# Patient Record
Sex: Female | Born: 1990 | Race: Black or African American | Hispanic: No | Marital: Single | Smoking: Current every day smoker
Health system: Southern US, Community
[De-identification: ages and names within clinical notes are randomized; demographics above are authoritative.]

---
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
## 2011-04-27 ENCOUNTER — Emergency Department: Payer: Self-pay | Admitting: Unknown Physician Specialty

## 2011-04-27 LAB — URINALYSIS, COMPLETE
Bilirubin,UR: NEGATIVE
Blood: NEGATIVE
Glucose,UR: NEGATIVE mg/dL (ref 0–75)
Ketone: NEGATIVE
Leukocyte Esterase: NEGATIVE
Nitrite: NEGATIVE
Ph: 5 (ref 4.5–8.0)
Protein: NEGATIVE
RBC,UR: 1 /HPF (ref 0–5)
Specific Gravity: 1.005 (ref 1.003–1.030)
Squamous Epithelial: 3
WBC UR: 1 /HPF (ref 0–5)

## 2011-04-27 LAB — DRUG SCREEN, URINE
Cannabinoid 50 Ng, Ur ~~LOC~~: NEGATIVE (ref ?–50)
Cocaine Metabolite,Ur ~~LOC~~: NEGATIVE (ref ?–300)
MDMA (Ecstasy)Ur Screen: NEGATIVE (ref ?–500)
Methadone, Ur Screen: NEGATIVE (ref ?–300)
Opiate, Ur Screen: NEGATIVE (ref ?–300)
Tricyclic, Ur Screen: NEGATIVE (ref ?–1000)

## 2011-04-27 LAB — CBC
MCHC: 32.9 g/dL (ref 32.0–36.0)
MCV: 89 fL (ref 80–100)
Platelet: 250 10*3/uL (ref 150–440)
RBC: 4.08 10*6/uL (ref 3.80–5.20)
RDW: 13.6 % (ref 11.5–14.5)
WBC: 9.8 10*3/uL (ref 3.6–11.0)

## 2011-04-27 LAB — COMPREHENSIVE METABOLIC PANEL
Albumin: 4.2 g/dL (ref 3.4–5.0)
Alkaline Phosphatase: 80 U/L (ref 50–136)
Bilirubin,Total: 0.7 mg/dL (ref 0.2–1.0)
Calcium, Total: 8.5 mg/dL (ref 8.5–10.1)
Chloride: 106 mmol/L (ref 98–107)
EGFR (African American): >60
EGFR (Non-African Amer.): >60
Glucose: 121 mg/dL — ABNORMAL HIGH (ref 65–99)
Osmolality: 286 (ref 275–301)
Potassium: 2.9 mmol/L — ABNORMAL LOW (ref 3.5–5.1)
SGOT(AST): 25 U/L (ref 15–37)
SGPT (ALT): 16 U/L
Sodium: 144 mmol/L (ref 136–145)
Total Protein: 7.7 g/dL (ref 6.4–8.2)

## 2011-04-27 LAB — MAGNESIUM: Magnesium: 1.9 mg/dL

## 2011-04-27 LAB — HCG, QUANTITATIVE, PREGNANCY: Beta Hcg, Quant.: <1 m[IU]/mL — ABNORMAL LOW

## 2011-06-18 IMAGING — US US OB < 14 WEEKS
1 series · 17 of 28 positions shown · non-contrast
Comparison: none

REASON FOR EXAM: unsure dates
COMMENTS:   LMP: August 2008

[Series 1: us ob < 14 weeks · 17 of 69 slices shown]
[im 1/69]
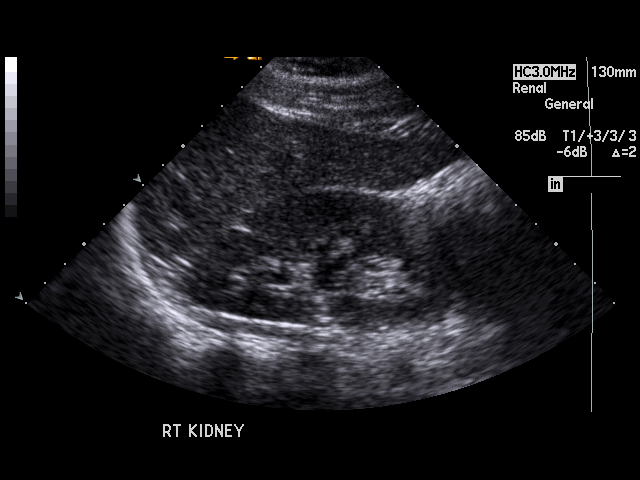
[im 6/69]
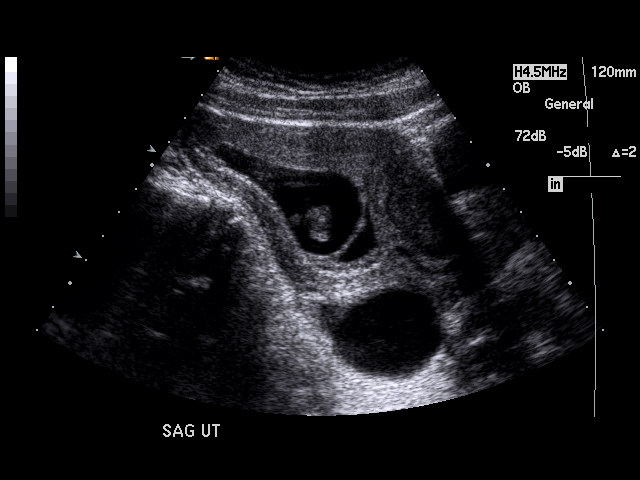
[im 11/69]
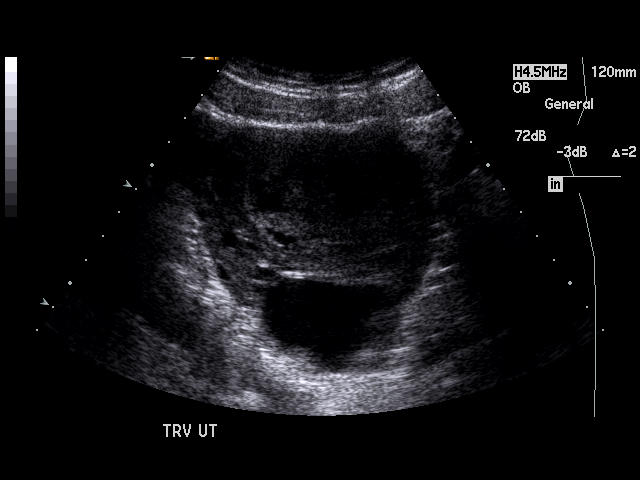
[im 13/69]
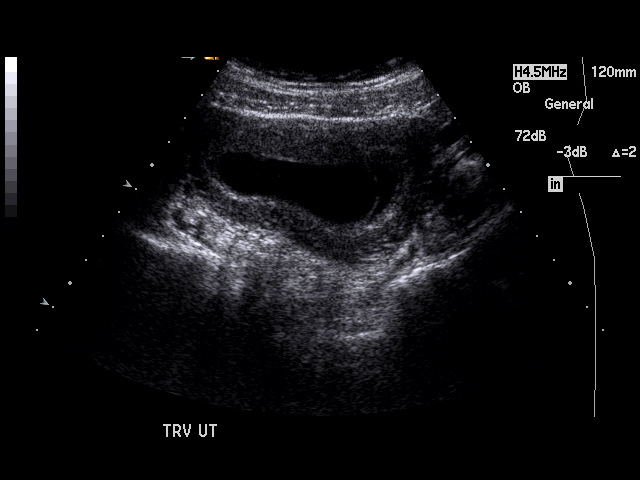
[im 18/69]
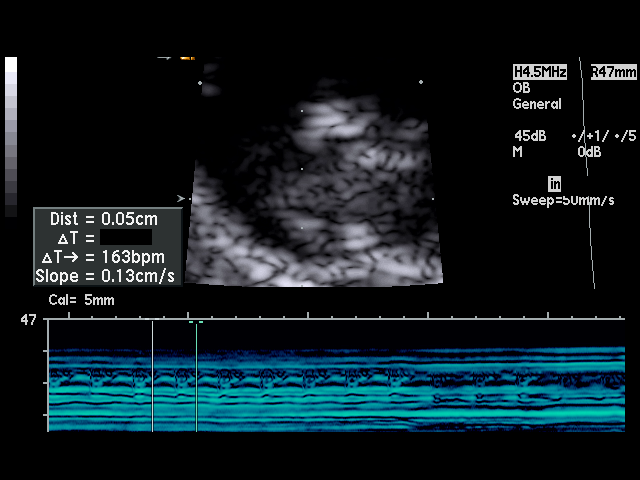
[im 23/69]
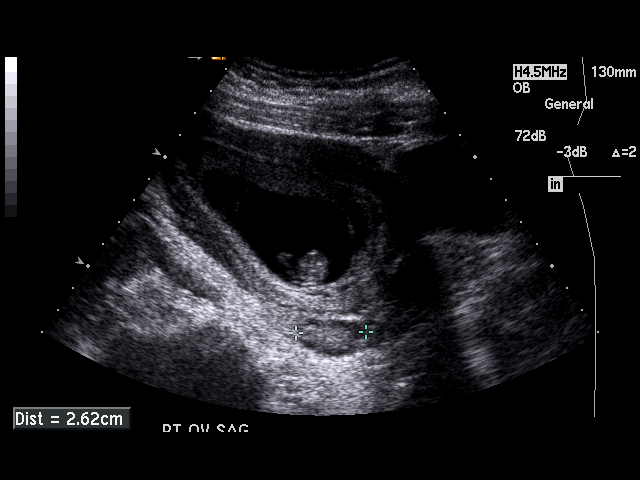
[im 26/69]
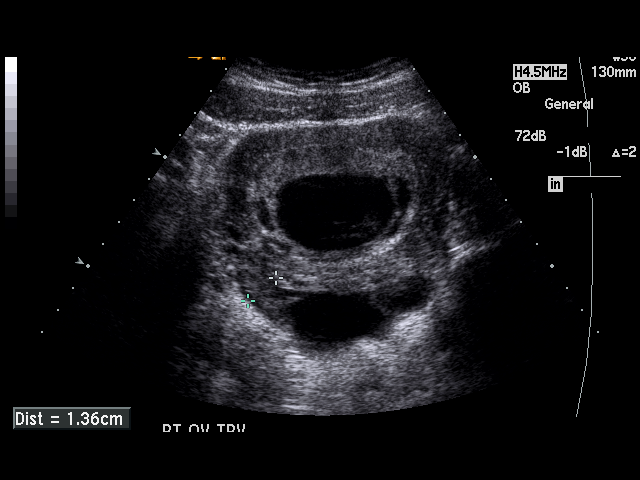
[im 31/69]
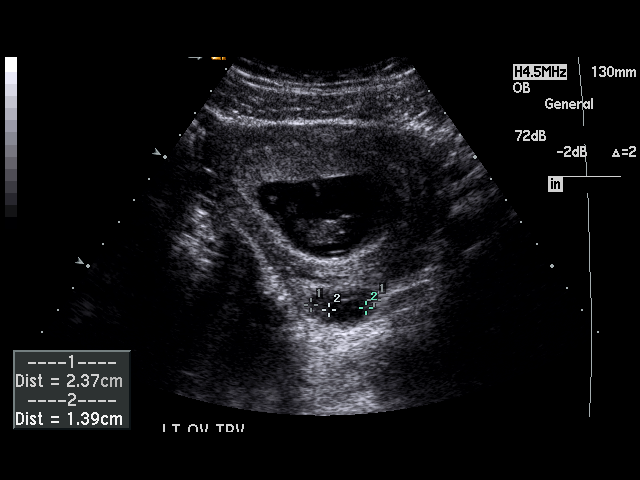
[im 36/69]
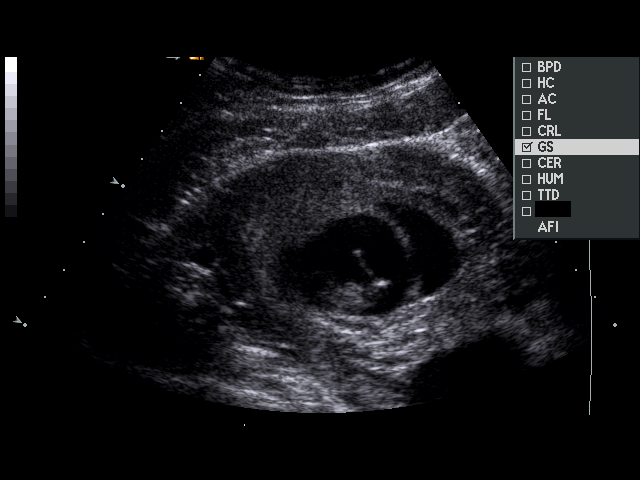
[im 38/69]
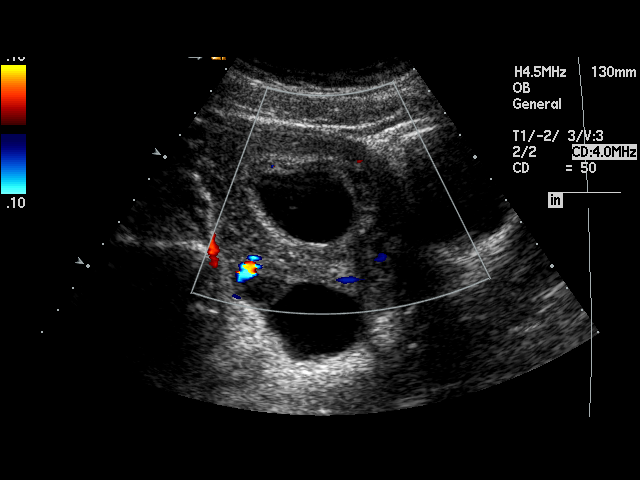
[im 43/69]
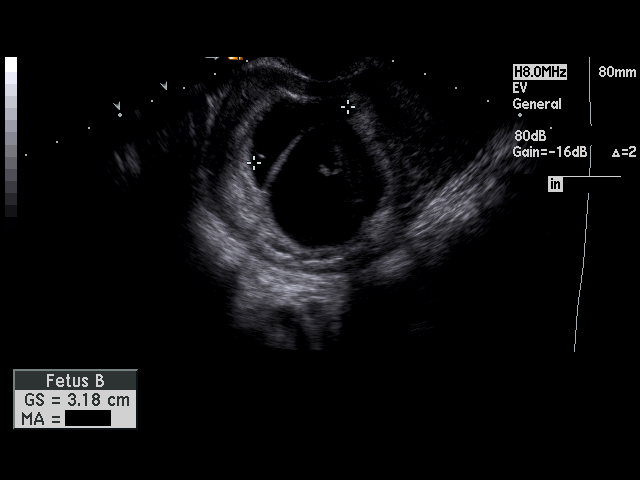
[im 46/69]
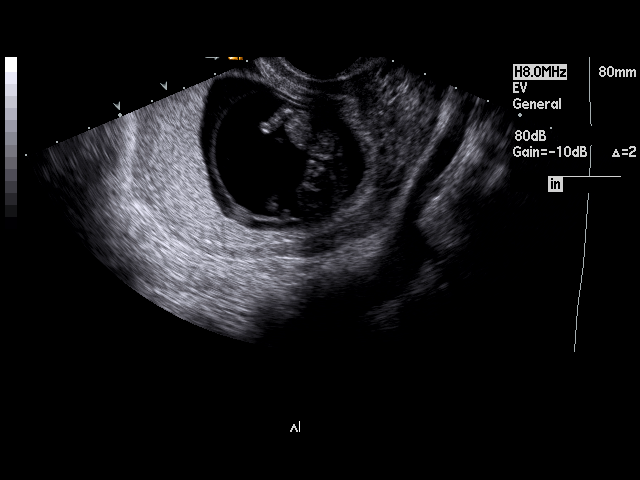
[im 51/69]
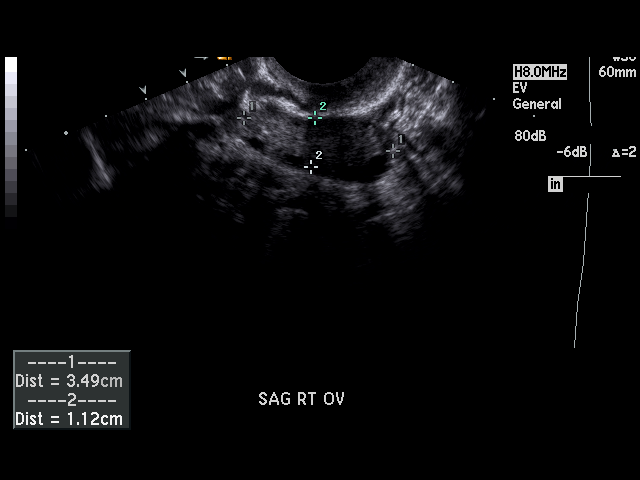
[im 56/69]
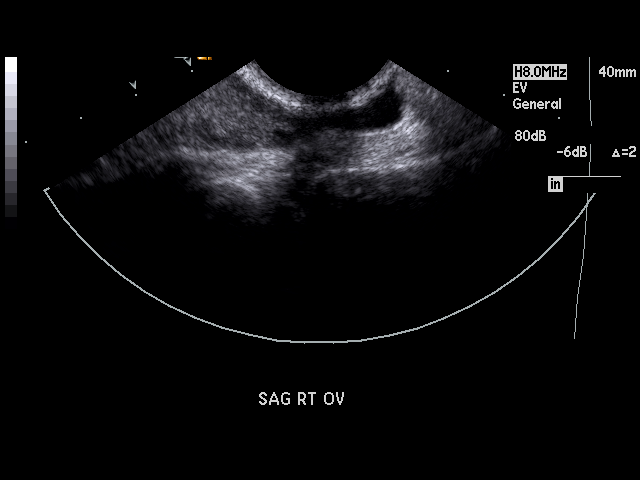
[im 58/69]
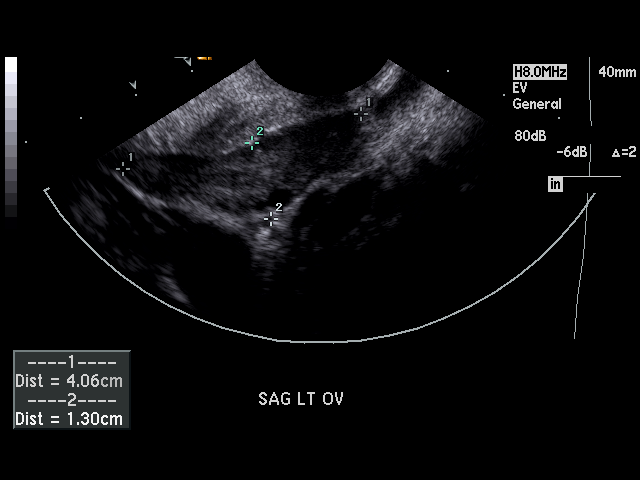
[im 63/69]
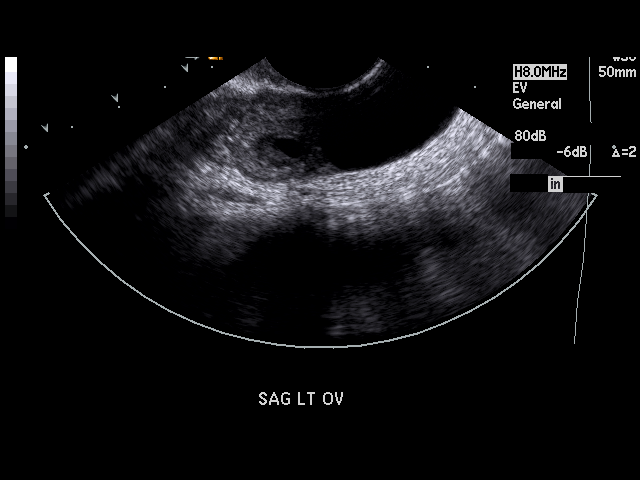
[im 69/69]
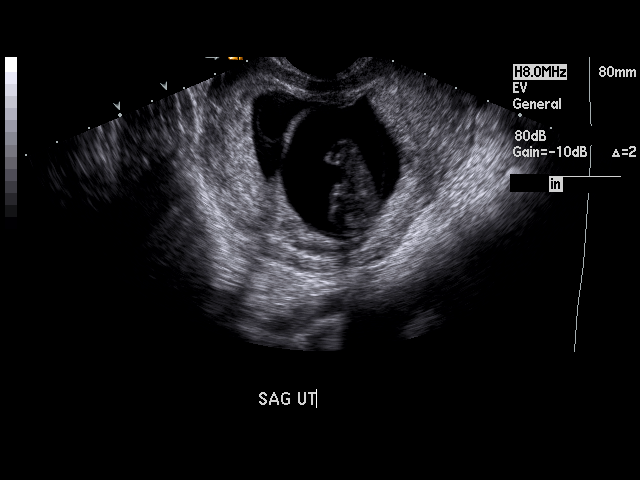

[17 of 28 positions shown; findings below may reference images not displayed]

PROCEDURE:     US  - US OB LESS THAN 14 WEEKS  - November 22, 2008  [DATE]

RESULT:     There are 2 saccular structures present cyst just being a twin
gestation. However, only one fetal pole is identified. The crown-rump length
of this fetal pole is 3.56 cm corresponding to an 10 week 3 day gestation.
The second portion of the gestational sac measures 2.76 cm which would
correspond to a 7 week 4 day pregnancy but again no fetal pole is
demonstrated. Fetal cardiac activity with a rate of 163 beats per minute was
demonstrated.

The maternal ovaries are normal in size measuring 3.5 x 2.9 x 1.1 cm on the
right. There is a nonsimple cyst associated with the left ovary which
measures 4 x 4.4 x 1.9 cm. The overall maximal dimension of the left ovary
is 4.1 x 1.7 x 1.3 cm.
IMPRESSION: 1. There is a gravid uterus present. There may be dual gestational sacs but
only one fetal pole is identified. This has estimated gestational age of 10
weeks 3 days + / -6 days with an estimated date of confinement 17 June, 2009.
Again, no twin B is demonstrated.

2. There is a dominant cyst associated with the left ovary measuring 4 x 4
0.4 x 1.9 cm. There is some debris within this cystic structure.

Correlation with the patient's clinical exam and beta-hCG will be needed.
Followup ultrasound is recommended.

## 2013-11-20 IMAGING — CT CT HEAD WITHOUT CONTRAST
2 series · 16 of 30 positions shown, 20 images · non-contrast
Comparison: none

REASON FOR EXAM: alcohol intoxication/ams
COMMENTS:

[Series 2: without · axial · non-contrast · 0.39mm/px · z∈[-176,-52]mm · 13 of 31 slices shown, 17 images]
[im 3/31  brain]
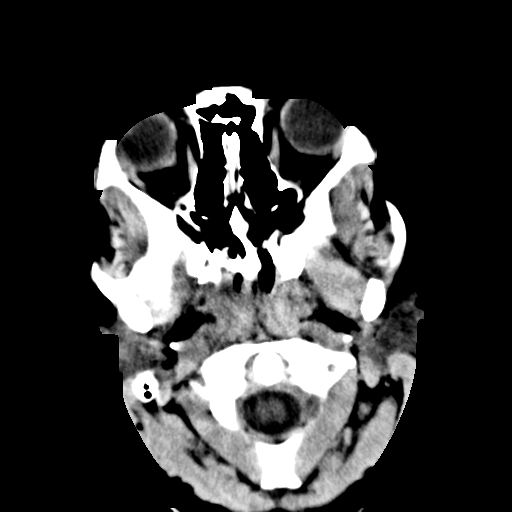
[im 3/31  bone]
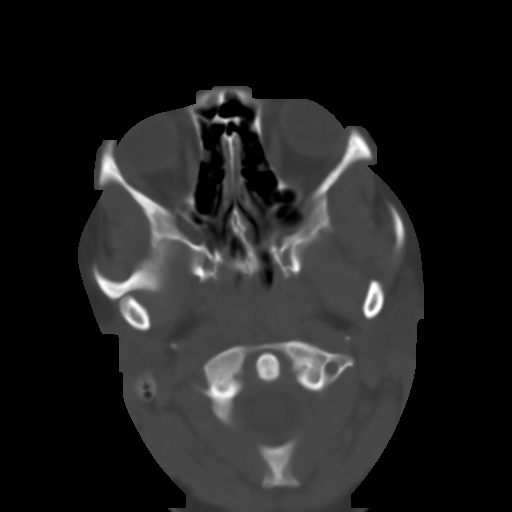
[im 5/31  brain]
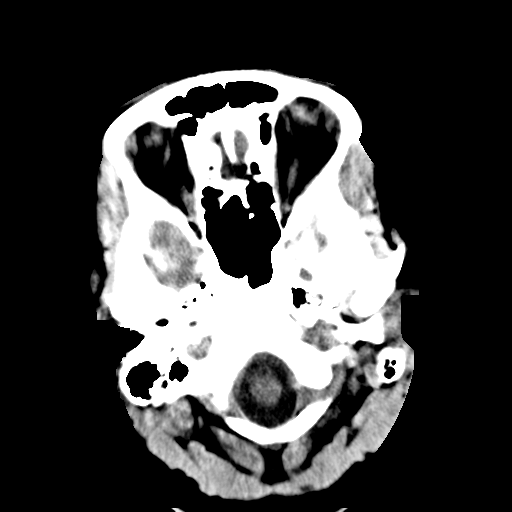
[im 7/31  brain]
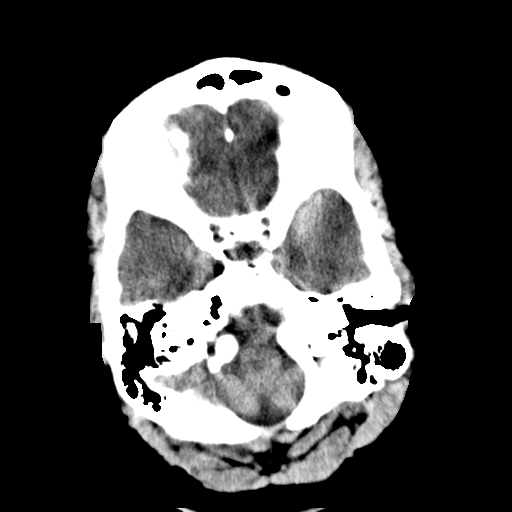
[im 9/31  brain]
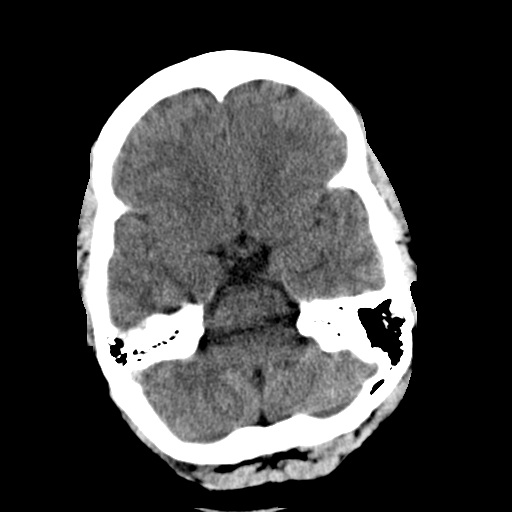
[im 11/31  brain]
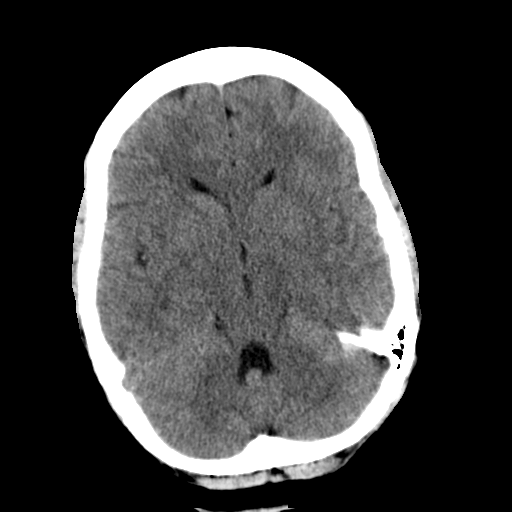
[im 11/31  bone]
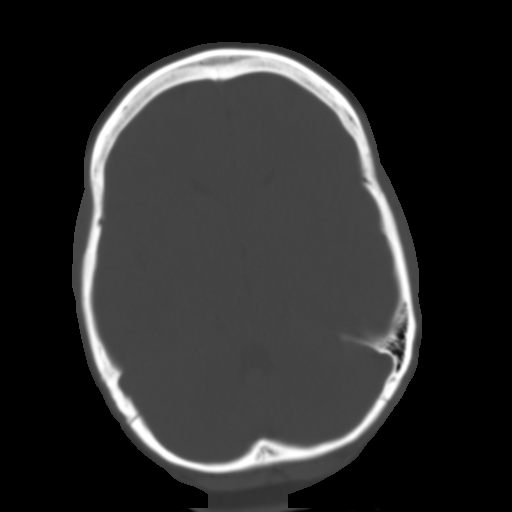
[im 13/31  brain]
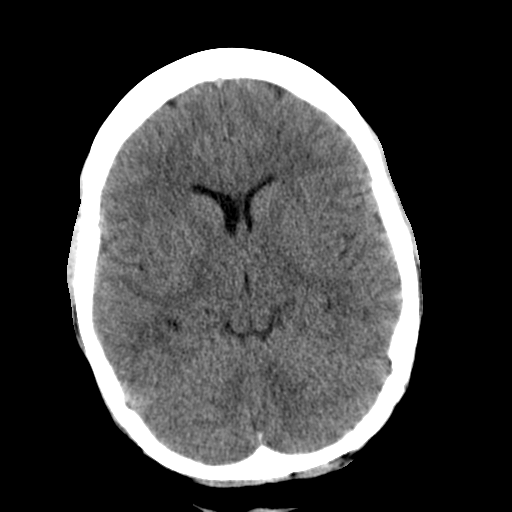
[im 16/31  brain]
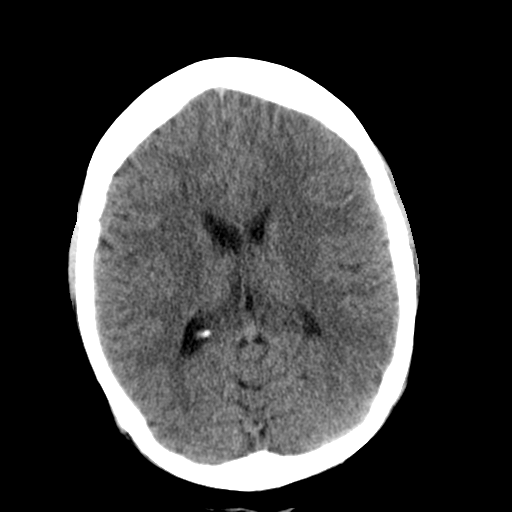
[im 18/31  brain]
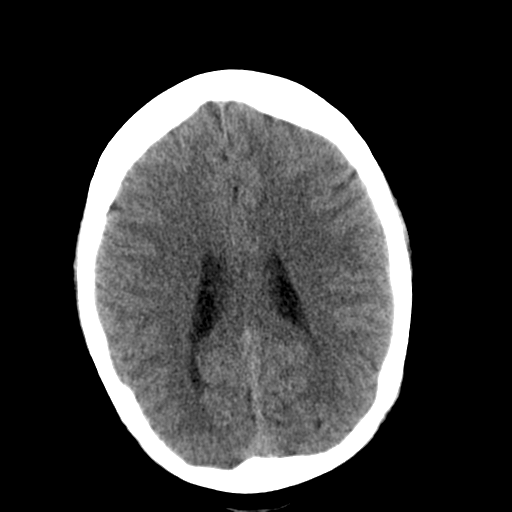
[im 20/31  brain]
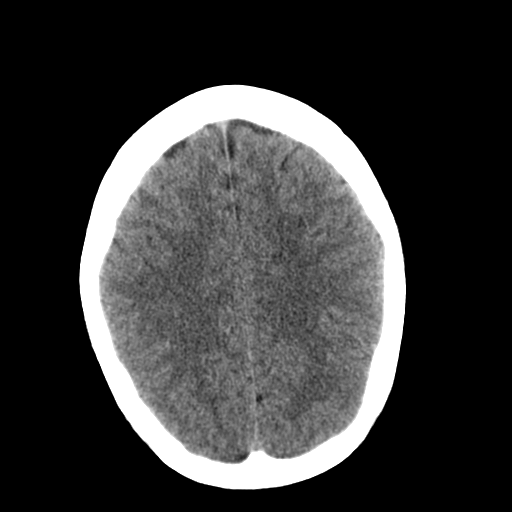
[im 20/31  bone]
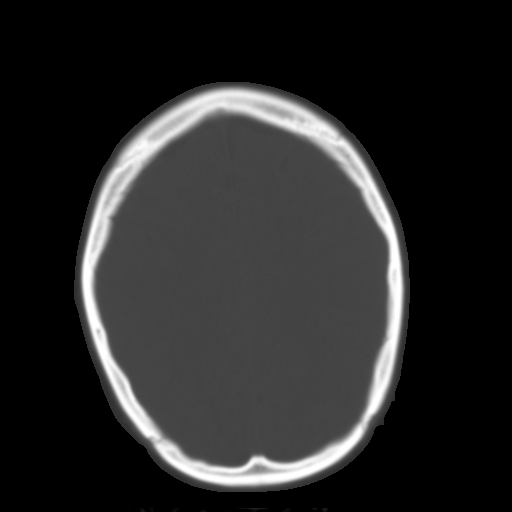
[im 22/31  brain]
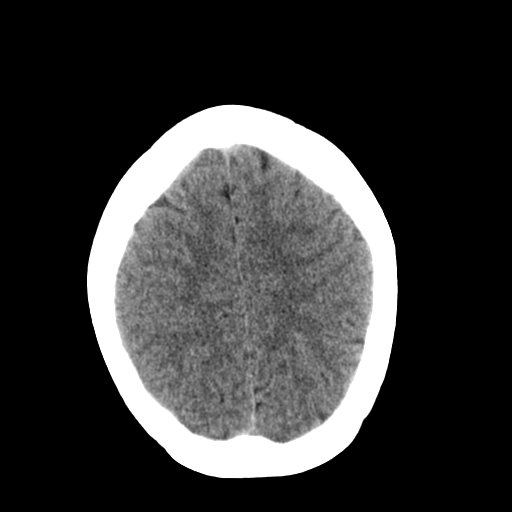
[im 24/31  brain]
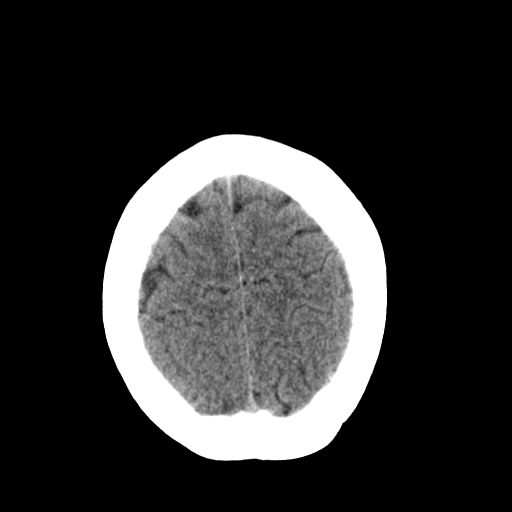
[im 26/31  brain]
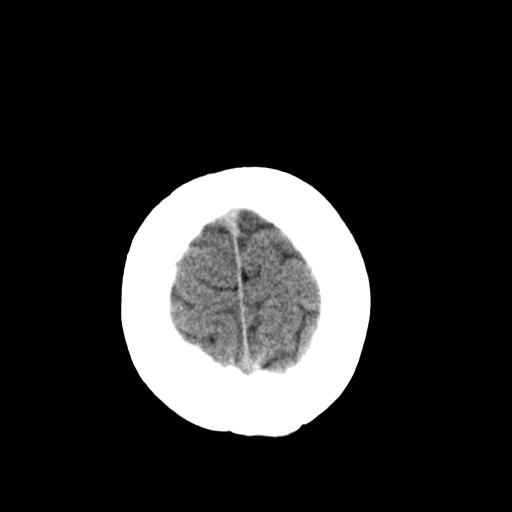
[im 28/31  brain]
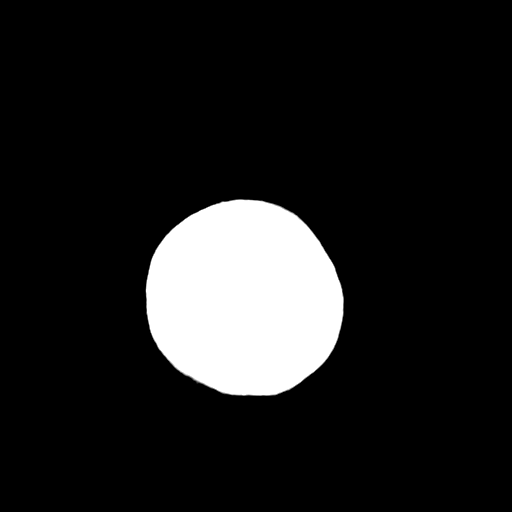
[im 28/31  bone]
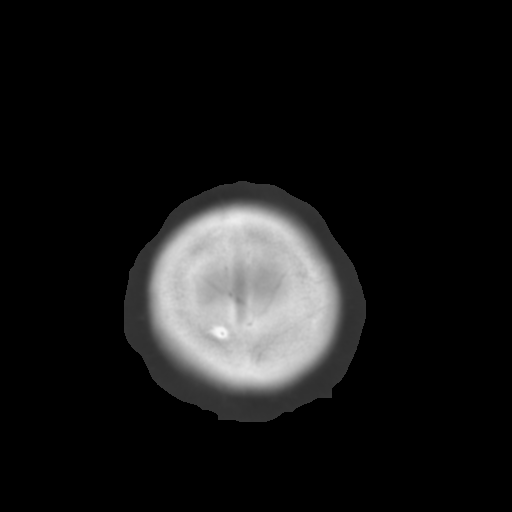

[Series 3: bone · axial · 0.39mm/px · z∈[-176,-136]mm · 3 of 31 slices shown]
[im 3/31  bone]
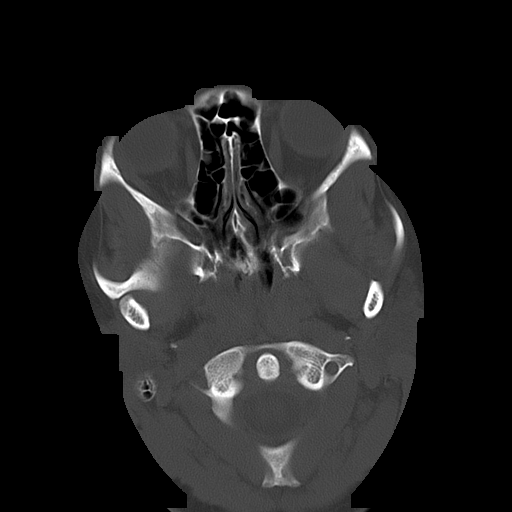
[im 7/31  bone]
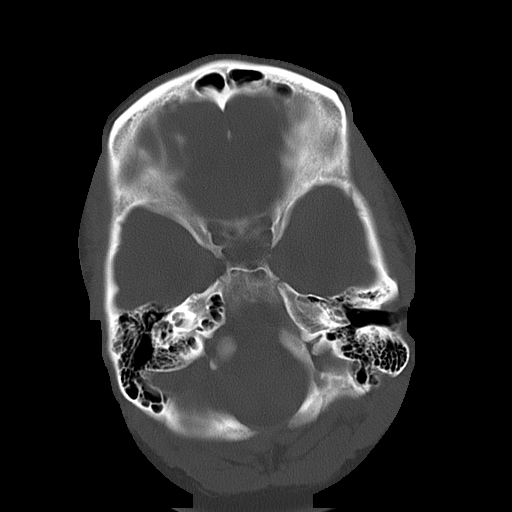
[im 11/31  bone]
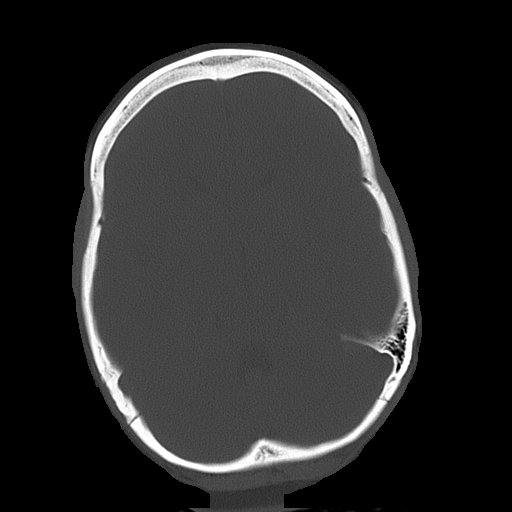

[16 of 30 positions shown; findings below may reference images not displayed]

PROCEDURE:     CT  - CT HEAD WITHOUT CONTRAST  - April 27, 2011  [DATE]

RESULT:     Emergent noncontrast CT of the brain is performed. The patient
has no previous exam for comparison.

The ventricles and sulci are normal. There is no hemorrhage. There is no
focal mass, mass-effect or midline shift. There is no evidence of edema or
territorial infarct. The bone windows demonstrate normal aeration of the
paranasal sinuses and mastoid air cells. There is no skull fracture
demonstrated.
IMPRESSION: 1. No acute intracranial abnormality.

## 2015-10-10 ENCOUNTER — Emergency Department
Admission: EM | Admit: 2015-10-10 | Discharge: 2015-10-10 | Disposition: A | Discharge status: Home / Self Care | Payer: Self-pay | Attending: Emergency Medicine | Admitting: Emergency Medicine

## 2015-10-10 ENCOUNTER — Encounter: Payer: Self-pay | Admitting: Emergency Medicine

## 2015-10-10 DIAGNOSIS — H6091 Unspecified otitis externa, right ear: Secondary | ICD-10-CM | POA: Insufficient documentation

## 2015-10-10 DIAGNOSIS — H6691 Otitis media, unspecified, right ear: Secondary | ICD-10-CM | POA: Insufficient documentation

## 2015-10-10 DIAGNOSIS — F17210 Nicotine dependence, cigarettes, uncomplicated: Secondary | ICD-10-CM | POA: Insufficient documentation

## 2015-10-10 MED ORDER — CIPROFLOXACIN-DEXAMETHASONE 0.3-0.1 % OT SUSP
4.0000 [drp] | Freq: Once | OTIC | Status: AC
  Administered 2015-10-10: 4 [drp] via OTIC
  Filled 2015-10-10: qty 7.5

## 2015-10-10 MED ORDER — AMOXICILLIN-POT CLAVULANATE 875-125 MG PO TABS: 1.0000 | ORAL_TABLET | Freq: Two times a day (BID) | ORAL | 0 refills | Status: AC

## 2015-10-10 MED ORDER — AMOXICILLIN-POT CLAVULANATE 875-125 MG PO TABS
1.0000 | ORAL_TABLET | Freq: Once | ORAL | Status: AC
  Administered 2015-10-10: 1 via ORAL
  Filled 2015-10-10: qty 1

## 2015-10-10 NOTE — ED Provider Notes (Signed)
[facility] Emergency Department Provider Note    First MD Initiated Contact with Patient 10/10/15 0407     (approximate)  I have reviewed the triage vital signs and the nursing notes.   HISTORY  Chief Complaint Otalgia and Facial Pain    HPI Kristin Shepard is a 25 y.o. female presents with right ear pain 3 weeks associated with right-sided facial pain with onset today. Patient states discomfort started after going swimming at [facility] approximately 3 weeks ago. Patient denies any fever afebrile on presentation temperature 98.6. Patient denies any drainage from the ear   Past medical history None There are no active problems to display for this patient.   Past surgical history None  Prior to Admission medications   Not on File    Allergies Review of patient's allergies indicates no known allergies.  No family history on file.  Social History Social History  Substance Use Topics  . Smoking status: Current Every Day Smoker    Packs/day: 0.50    Types: Cigarettes  . Smokeless tobacco: Never Used  . Alcohol use No    Review of Systems Constitutional: No fever/chills Eyes: No visual changes. ENT: No sore throat.Positive for right ear pain Cardiovascular: Denies chest pain. Respiratory: Denies shortness of breath. Gastrointestinal: No abdominal pain.  No nausea, no vomiting.  No diarrhea.  No constipation. Genitourinary: Negative for dysuria. Musculoskeletal: Negative for back pain. Skin: Negative for rash. Neurological: Negative for headaches, focal weakness or numbness.  10-point ROS otherwise negative.  ____________________________________________   PHYSICAL EXAM:  VITAL SIGNS: ED Triage Vitals  Enc Vitals Group     BP 10/10/15 0355 125/67     Pulse Rate 10/10/15 0355 68     Resp 10/10/15 0355 18     Temp 10/10/15 0355 98.6 F (37 C)     Temp Source 10/10/15 0355 Oral     SpO2 10/10/15 0355 100 %     Weight  10/10/15 0327 160 lb (72.6 kg)     Height 10/10/15 0327 5\' 2"  (1.575 m)     Head Circumference --      Peak Flow --      Pain Score 10/10/15 0327 8     Pain Loc --      Pain Edu? --      Excl. in GC? --     Constitutional: Alert and oriented. Well appearing and in no acute distress. Eyes: Conjunctivae are normal. PERRL. EOMI. Head: Atraumatic. Ears:  Exudate noted right external auditory canal with TM dullness and exudate posterior to the TM. Nose: No congestion/rhinnorhea. Mouth/Throat: Mucous membranes are moist.  Oropharynx non-erythematous. Neck: No stridor.  No meningeal signs.  Cardiovascular: Normal rate, regular rhythm. Good peripheral circulation. Grossly normal heart sounds. Respiratory: Normal respiratory effort.  No retractions. Lungs CTAB. Gastrointestinal: Soft and nontender. No distention.  Musculoskeletal: No lower extremity tenderness nor edema. No gross deformities of extremities. Neurologic:  Normal speech and language. No gross focal neurologic deficits are appreciated.  Skin:  Skin is warm, dry and intact. No rash noted. Psychiatric: Mood and affect are normal. Speech and behavior are normal.    Procedures     INITIAL IMPRESSION / ASSESSMENT AND PLAN / ED COURSE  Pertinent labs & imaging results that were available during my care of the patient were reviewed by me and considered in my medical decision making (see chart for details).  History physical exam consistent with acute otitis externa and media on the right.  Clinical Course    ____________________________________________  FINAL CLINICAL IMPRESSION(S) / ED DIAGNOSES  Final diagnoses:  Otitis externa, right  Acute right otitis media, recurrence not specified, unspecified otitis media type     MEDICATIONS GIVEN DURING THIS VISIT:  Medications - No data to display   NEW OUTPATIENT MEDICATIONS STARTED DURING THIS VISIT:  New Prescriptions   No medications on file    Modified  Medications   No medications on file    Discontinued Medications   No medications on file     Note:  This document was prepared using Dragon voice recognition software and may include unintentional dictation errors.    Darci Currentandolph N Brown, MD 10/10/15 [phone]

## 2015-10-10 NOTE — ED Triage Notes (Signed)
Pt presents to ED with c/o right ear pain for over 3 weeks and right sided facial pain "all day today". Denies fever or ear drainage.

## 2018-08-11 ENCOUNTER — Encounter: Payer: Self-pay | Admitting: Nurse Practitioner

## 2018-11-09 ENCOUNTER — Ambulatory Visit: Payer: Self-pay

## 2018-11-11 ENCOUNTER — Ambulatory Visit: Payer: Self-pay | Admitting: Physician Assistant

## 2018-11-11 ENCOUNTER — Other Ambulatory Visit: Payer: Self-pay

## 2018-11-11 DIAGNOSIS — Z113 Encounter for screening for infections with a predominantly sexual mode of transmission: Secondary | ICD-10-CM

## 2018-11-11 DIAGNOSIS — N760 Acute vaginitis: Secondary | ICD-10-CM

## 2018-11-11 DIAGNOSIS — B9689 Other specified bacterial agents as the cause of diseases classified elsewhere: Secondary | ICD-10-CM

## 2018-11-11 LAB — WET PREP FOR TRICH, YEAST, CLUE
Trichomonas Exam: NEGATIVE
Yeast Exam: NEGATIVE

## 2018-11-11 MED ORDER — METRONIDAZOLE 500 MG PO TABS: 500.0000 mg | ORAL_TABLET | Freq: Two times a day (BID) | ORAL | 0 refills | Status: AC

## 2018-11-12 ENCOUNTER — Encounter: Payer: Self-pay | Admitting: Physician Assistant

## 2018-11-12 NOTE — Progress Notes (Signed)
    STI clinic/screening visit  Subjective:  Kristin Shepard is a 28 y.o. female being seen today for an STI screening visit. The patient reports they do have symptoms.  Patient has the following medical conditions:  There are no active problems to display for this patient.    Chief Complaint  Patient presents with  . SEXUALLY TRANSMITTED DISEASE    HPI  Patient reports that she has noticed a vaginal discharge with odor for the last 2 days.  Denies other symptoms.  LMP 10/18/2018 and normal.  See flowsheet for further details and programmatic requirements.    The following portions of the patient's history were reviewed and updated as appropriate: allergies, current medications, past medical history, past social history, past surgical history and problem list.  Objective:  There were no vitals filed for this visit.  Physical Exam Constitutional:      General: She is not in acute distress.    Appearance: Normal appearance.  HENT:     Head: Normocephalic and atraumatic.     Mouth/Throat:     Mouth: Mucous membranes are moist.     Pharynx: Oropharynx is clear. No oropharyngeal exudate or posterior oropharyngeal erythema.  Eyes:     Conjunctiva/sclera: Conjunctivae normal.  Neck:     Musculoskeletal: Neck supple.  Pulmonary:     Effort: Pulmonary effort is normal.  Abdominal:     Palpations: Abdomen is soft. There is no mass.     Tenderness: There is no abdominal tenderness. There is no guarding or rebound.  Genitourinary:    General: Normal vulva.     Rectum: Normal.     Comments: External genitalia/pubic area without nits, lice, edema, erythema, lesions and inguinal adenopathy. Vagina with normal mucosa and small amount of thin, grayish discharge, pH=>4.5. Cervix without visible lesions. Uterus firm, mobile, nt, no masses, no CMT, no adnexal tenderness or fullness. Lymphadenopathy:     Cervical: No cervical adenopathy.  Skin:    General: Skin is warm and dry.   Findings: No bruising, erythema, lesion or rash.  Neurological:     Mental Status: She is alert and oriented to person, place, and time.  Psychiatric:        Mood and Affect: Mood normal.        Behavior: Behavior normal.        Thought Content: Thought content normal.        Judgment: Judgment normal.       Assessment and Plan:  Kristin Shepard is a 27 y.o. female presenting to the [facility] Department for STI screening  1. Screening for STD (sexually transmitted disease) Patient here with symptoms today.   Declines blood work today. Rec condoms with all sex. Await test results.  Counseled that RN will call if needs to RTC for further treatment once results are back. - WET PREP FOR [place], YEAST, CLUE - Gonococcus culture - [facility]  2. BV (bacterial vaginosis) Will treat for BV with Metronidazole 500mg  #14 1 po BID for 7 days with food, no EtOH for 24 hr before and until 72 hr after completing medicine. No sex for 7 days  Rec use OTC antifungal cream if has itching during or after completing antibiotic. - metroNIDAZOLE (FLAGYL) 500 MG tablet; Take 1 tablet (500 mg total) by mouth 2 (two) times daily for 7 days.  Dispense: 14 tablet; Refill: 0     No follow-ups on file.  No future appointments.  Jerene Dilling, PA

## 2018-11-16 LAB — GONOCOCCUS CULTURE

## 2019-01-15 ENCOUNTER — Other Ambulatory Visit: Payer: Self-pay

## 2019-01-15 ENCOUNTER — Encounter: Payer: Self-pay | Admitting: Family Medicine

## 2019-01-15 ENCOUNTER — Ambulatory Visit: Payer: Self-pay | Admitting: Family Medicine

## 2019-01-15 DIAGNOSIS — Z113 Encounter for screening for infections with a predominantly sexual mode of transmission: Secondary | ICD-10-CM

## 2019-01-15 LAB — WET PREP FOR TRICH, YEAST, CLUE
Trichomonas Exam: NEGATIVE
Yeast Exam: NEGATIVE

## 2019-01-15 MED ORDER — METRONIDAZOLE 500 MG PO TABS: 500.0000 mg | ORAL_TABLET | Freq: Two times a day (BID) | ORAL | 0 refills | Status: AC

## 2019-01-15 NOTE — Progress Notes (Signed)
In for visit with c/o discharge & odor; declines HIV/RPR testing Debera Lat, RN Wet prep reviewed-+BV treated per standing order Debera Lat, RN

## 2019-01-15 NOTE — Progress Notes (Signed)
[facility] Department STI clinic/screening visit  Subjective:  Kristin Shepard is a 28 y.o. female being seen today for  Chief Complaint  Patient presents with  . SEXUALLY TRANSMITTED DISEASE     The patient reports they do have symptoms. Patient reports that they might desire a pregnancy in the next year. They reported they are not interested in discussing contraception today.   Patient has the following medical conditions:  There are no problems to display for this patient.   HPI  Pt reports has had vaginal d/c x1 wk, +odor. Gets BV and wondering if it is back.   See flowsheet for further details and programmatic requirements.    Patient's last menstrual period was 01/09/2019 (exact date). Last sex: 11/24 BCM: condoms, sometimes Desires EC? n/a  No components found for: HCV  The following portions of the patient's history were reviewed and updated as appropriate: allergies, current medications, past medical history, past social history, past surgical history and problem list.  Objective:  There were no vitals filed for this visit.   Physical Exam Vitals and nursing note reviewed.  Constitutional:      Appearance: Normal appearance.  HENT:     Head: Normocephalic and atraumatic.     Mouth/Throat:     Mouth: Mucous membranes are moist.     Pharynx: Oropharynx is clear. No oropharyngeal exudate or posterior oropharyngeal erythema.  Pulmonary:     Effort: Pulmonary effort is normal.  Abdominal:     General: Abdomen is flat.     Palpations: There is no mass.     Tenderness: There is no abdominal tenderness. There is no rebound.  Genitourinary:    General: Normal vulva.     Exam position: Lithotomy position.     Pubic Area: No rash or pubic lice.      Labia:        Right: No rash or lesion.        Left: No rash or lesion.      Vagina: Vaginal discharge (moderate, white, ph >4.5) present. No erythema, bleeding or lesions.     Cervix: No cervical motion  tenderness, discharge, friability, lesion or erythema.     Uterus: Normal.      Adnexa: Right adnexa normal and left adnexa normal.     Rectum: Normal.  Lymphadenopathy:     Head:     Right side of head: No preauricular or posterior auricular adenopathy.     Left side of head: No preauricular or posterior auricular adenopathy.     Cervical: No cervical adenopathy.     Upper Body:     Right upper body: No supraclavicular or axillary adenopathy.     Left upper body: No supraclavicular or axillary adenopathy.     Lower Body: No right inguinal adenopathy. No left inguinal adenopathy.  Skin:    General: Skin is warm and dry.     Findings: No rash.  Neurological:     Mental Status: She is alert and oriented to person, place, and time.      Assessment and Plan:  Kristin Shepard is a 28 y.o. female presenting to the [facility] Department for STI screening    1. Screening examination for venereal disease -Screenings today as below. Treat wet prep per standing order, will also treat for BV -Patient does not meet criteria for HepB, HepC Screening. Declines HIV and syphilis screenings. -Counseled on warning s/sx and when to seek care. Recommended condom use with all sex  and discussed importance of condom use for STI prevention.  - WET PREP FOR TRICH, YEAST, CLUE - Chlamydia/Gonorrhea [facility]  2. Bacterial Vaginosis -Wet prep +for BV. RN treated per standing order, see RN note.   Encouraged pt to RTC if she becomes interested in discussing BCM.   Return for screening as needed.  No future appointments.  Ann Held, PA-C

## 2019-01-16 ENCOUNTER — Encounter: Payer: Self-pay | Admitting: Family Medicine

## 2019-08-06 ENCOUNTER — Ambulatory Visit (LOCAL_COMMUNITY_HEALTH_CENTER): Payer: Self-pay

## 2019-08-06 ENCOUNTER — Other Ambulatory Visit: Payer: Self-pay

## 2019-08-06 VITALS — BP 111/73 | Ht 62.0 in | Wt 197.0 lb

## 2019-08-06 DIAGNOSIS — Z3201 Encounter for pregnancy test, result positive: Secondary | ICD-10-CM

## 2019-08-06 LAB — PREGNANCY, URINE: Preg Test, Ur: POSITIVE — AB

## 2019-08-06 MED ORDER — PRENATAL 27-0.8 MG PO TABS: 1.0000 | ORAL_TABLET | Freq: Every day | ORAL | 0 refills | Status: AC

## 2019-08-06 NOTE — Progress Notes (Signed)
UPT positive today. Unsure where she will go for prenatal care. Community/prenatal  resource provider list given to pt. Consulted with Beatris Si, PA re: pt's report that she was high risk during last preg. d/t "umbilical cord being in different location." Enc to establish prenatal care early.Counseled re: warning signs of ectopic preg. And advised to seek immediate medical attn if symptoms arise. Pt denies any symptoms presently.  Prenatal vitamins dispensed today. To clerk for preadmit. Jerel Shepherd, RN

## 2019-09-10 ENCOUNTER — Other Ambulatory Visit: Payer: Self-pay | Admitting: Obstetrics

## 2019-09-10 ENCOUNTER — Other Ambulatory Visit: Payer: Self-pay

## 2019-09-10 ENCOUNTER — Encounter: Payer: Self-pay | Admitting: Obstetrics

## 2019-09-10 ENCOUNTER — Ambulatory Visit (INDEPENDENT_AMBULATORY_CARE_PROVIDER_SITE_OTHER): Payer: Medicaid Other | Admitting: Obstetrics

## 2019-09-10 ENCOUNTER — Other Ambulatory Visit (HOSPITAL_COMMUNITY)
Admission: RE | Admit: 2019-09-10 | Discharge: 2019-09-10 | Disposition: A | Discharge status: Home / Self Care | Payer: Medicaid Other | Source: Ambulatory Visit | Attending: Obstetrics | Admitting: Obstetrics

## 2019-09-10 VITALS — BP 120/80 | Wt 199.0 lb

## 2019-09-10 DIAGNOSIS — Z3480 Encounter for supervision of other normal pregnancy, unspecified trimester: Secondary | ICD-10-CM | POA: Insufficient documentation

## 2019-09-10 DIAGNOSIS — N912 Amenorrhea, unspecified: Secondary | ICD-10-CM

## 2019-09-10 DIAGNOSIS — Z3A09 9 weeks gestation of pregnancy: Secondary | ICD-10-CM

## 2019-09-10 DIAGNOSIS — O0990 Supervision of high risk pregnancy, unspecified, unspecified trimester: Secondary | ICD-10-CM

## 2019-09-10 DIAGNOSIS — O0991 Supervision of high risk pregnancy, unspecified, first trimester: Secondary | ICD-10-CM

## 2019-09-10 NOTE — Progress Notes (Signed)
New Obstetric Patient H&P    Chief Complaint: "Desires prenatal care"   History of Present Illness: Patient is a 29 y.o. G2P1001 Not Hispanic or Latino female, LMP 07/09/2019 presents with amenorrhea and positive home pregnancy test. Based on her  LMP, her EDD is Estimated Date of Delivery: 04/14/20 and her EGA is [redacted]w[redacted]d. Cycles are 6. days, regular, and occur approximately every : 28 days. Her last pap smear was a few years ago and was unknown result as she cannot remember.    She had a urine pregnancy test which was positive about 2 week(s)  ago. Her last menstrual period was normal and lasted for  approximately 5 day(s). Since her LMP she claims she has experienced mild nausea. She denies vaginal bleeding. Her past medical history is noncontributory. Her prior pregnancies are notable for tobacco use, a "cord issue" per her report.  Since her LMP, she admits to the use of tobacco products  She smokes cigars, up to three per day.She wants to cut back. She claims she has gained   no pounds since the start of her pregnancy.  There are cats in the home in the home  no  She admits close contact with children on a regular basis  yes  She has had chicken pox in the past no She has had Tuberculosis exposures, symptoms, or previously tested positive for TB   no Current or past history of domestic violence. no  Genetic Screening/Teratology Counseling: (Includes patient, baby's father, or anyone in either family with:)   1. Patient's age >/= 53 [facility]  no 2. Thalassemia ([place], [place], Mediterranean, or Asian background): MCV<80  no 3. Neural tube defect (meningomyelocele, spina bifida, anencephaly)  no 4. Congenital heart defect  no  5. Down syndrome  no 6. Tay-Sachs (Jewish, [place]  no 7. Canavan's Disease  no 8. Sickle cell disease or trait (African)  no  9. Hemophilia or other blood disorders  no  10. Muscular dystrophy  no  11. Cystic fibrosis  no  12. Huntington's  Chorea  no  13. Mental retardation/autism  no 14. Other inherited genetic or chromosomal disorder  no 15. Maternal metabolic disorder (DM, PKU, etc)  no 16. Patient or FOB with a child with a birth defect not listed above no  16a. Patient or FOB with a birth defect themselves yes- FOB has a son born with cleft lip and palate 17. Recurrent pregnancy loss, or stillbirth  no  18. Any medications since LMP other than prenatal vitamins (include vitamins, supplements, OTC meds, drugs, alcohol)  no 19. Any other genetic/environmental exposure to discuss  no  Infection History:   1. Lives with someone with TB or TB exposed  no  2. Patient or partner has history of genital herpes  no 3. Rash or viral illness since LMP  no 4. History of STI (GC, CT, HPV, syphilis, HIV)  She denies 5. History of recent travel :  no  Other pertinent information:  no     Review of Systems:10 point review of systems negative unless otherwise noted in HPI  Past Medical History:  History reviewed. No pertinent past medical history.  Past Surgical History:  History reviewed. No pertinent surgical history.  Gynecologic History: Patient's last menstrual period was 07/09/2019.  Obstetric History: G2P1001  Family History:  Family History  Problem Relation Age of Onset  . Breast cancer Maternal Grandmother        46 y/o  Social History:  Social History   Socioeconomic History  . Marital status: Single    Spouse name: Not on file  . Number of children: Not on file  . Years of education: Not on file  . Highest education level: Not on file  Occupational History  . Not on file  Tobacco Use  . Smoking status: Current Every Day Smoker    Packs/day: 0.10    Types: Cigarettes, Cigars  . Smokeless tobacco: Never Used  . Tobacco comment: smokes 2 cig/day  Vaping Use  . Vaping Use: Never used  Substance and Sexual Activity  . Alcohol use: Not Currently    Comment: last ETOH "months ago" per pt  . Drug  use: Not Currently  . Sexual activity: Yes    Partners: Male    Birth control/protection: None  Other Topics Concern  . Not on file  Social History Narrative  . Not on file   Social Determinants of Health   Financial Resource Strain:   . Difficulty of Paying Living Expenses:   Food Insecurity:   . Worried About Programme researcher, broadcasting/film/video in the Last Year:   . Barista in the Last Year:   Transportation Needs:   . Freight forwarder (Medical):   Marland Kitchen Lack of Transportation (Non-Medical):   Physical Activity:   . Days of Exercise per Week:   . Minutes of Exercise per Session:   Stress:   . Feeling of Stress :   Social Connections:   . Frequency of Communication with Friends and Family:   . Frequency of Social Gatherings with Friends and Family:   . Attends Religious Services:   . Active Member of Clubs or Organizations:   . Attends Banker Meetings:   Marland Kitchen Marital Status:   Intimate Partner Violence: Not At Risk  . Fear of Current or Ex-Partner: No  . Emotionally Abused: No  . Physically Abused: No  . Sexually Abused: No    Allergies:  No Known Allergies  Medications: Prior to Admission medications   Medication Sig Start Date End Date Taking? Authorizing Provider  Prenatal Vit-Fe Fumarate-FA (MULTIVITAMIN-PRENATAL) 27-0.8 MG TABS tablet Take 1 tablet by mouth daily at 12 noon. 08/06/19 11/14/19 Yes Federico Flake, MD    Physical Exam Vitals: Blood pressure 120/80, weight 199 lb (90.3 kg), last menstrual period 07/09/2019.  General: NAD HEENT: normocephalic, anicteric Thyroid: no enlargement, no palpable nodules Pulmonary: No increased work of breathing, CTAB Cardiovascular: RRR, distal pulses 2+ Abdomen: NABS, soft, non-tender, non-distended.  Umbilicus without lesions.  No hepatomegaly, splenomegaly or masses palpable. No evidence of hernia  Genitourinary:  External: Normal external female genitalia.  Normal urethral meatus, normal  Bartholin's  and Skene's glands.    Vagina: Normal vaginal mucosa, no evidence of prolapse.    Cervix: Grossly normal in appearance, no bleeding  Uterus: anteverted, slightly enlarged, mobile, normal contour.  No CMT  Adnexa: ovaries non-enlarged, no adnexal masses  Rectal: deferred Extremities: no edema, erythema, or tenderness Neurologic: Grossly intact Psychiatric: mood appropriate, affect full   Assessment: 29 y.o. G2P1001 at [redacted]w[redacted]d presenting to initiate prenatal care  Plan: 1) Avoid alcoholic beverages. 2) Patient encouraged not to smoke.  3) Discontinue the use of all non-medicinal drugs and chemicals.  4) Take prenatal vitamins daily.  5) Nutrition, food safety (fish, cheese advisories, and high nitrite foods) and exercise discussed. 6) Hospital and practice style discussed with cross coverage system.  7) Genetic Screening, such as with  1st Trimester Screening, cell free fetal DNA, AFP testing, and Ultrasound, as well as with amniocentesis and CVS as appropriate, is discussed with patient. At the conclusion of today's visit patient requested genetic testing. She would like the MaternT testing and the Inheritest 8) Patient is asked about travel to areas at risk for the Zika virus, and counseled to avoid travel and exposure to mosquitoes or sexual partners who may have themselves been exposed to the virus. Testing is discussed, and will be ordered as appropriate.   Pap smear, urine culture and UDS today. STI screening for trich, GC and CT completed today. She will have a dating sono next week and the MaternT testing/ Inheritest then.  Breastfeeding discussed and encouraged today. RTC in 1 week. Mirna Mires, CNM  09/10/2019 10:08 AM

## 2019-09-10 NOTE — Patient Instructions (Signed)
First Trimester of Pregnancy  The first trimester of pregnancy is from week 1 until the end of week 13 (months 1 through 3). During this time, your baby will begin to develop inside you. At 6-8 weeks, the eyes and face are formed, and the heartbeat can be seen on ultrasound. At the end of 12 weeks, all the baby's organs are formed. Prenatal care is all the medical care you receive before the birth of your baby. Make sure you get good prenatal care and follow all of your doctor's instructions. Follow these instructions at home: Medicines  Take over-the-counter and prescription medicines only as told by your doctor. Some medicines are safe and some medicines are not safe during pregnancy.  Take a prenatal vitamin that contains at least 600 micrograms (mcg) of folic acid.  If you have trouble pooping (constipation), take medicine that will make your stool soft (stool softener) if your doctor approves. Eating and drinking   Eat regular, healthy meals.  Your doctor will tell you the amount of weight gain that is right for you.  Avoid raw meat and uncooked cheese.  If you feel sick to your stomach (nauseous) or throw up (vomit): ? Eat 4 or 5 small meals a day instead of 3 large meals. ? Try eating a few soda crackers. ? Drink liquids between meals instead of during meals.  To prevent constipation: ? Eat foods that are high in fiber, like fresh fruits and vegetables, whole grains, and beans. ? Drink enough fluids to keep your pee (urine) clear or pale yellow. Activity  Exercise only as told by your doctor. Stop exercising if you have cramps or pain in your lower belly (abdomen) or low back.  Do not exercise if it is too hot, too humid, or if you are in a place of great height (high altitude).  Try to avoid standing for long periods of time. Move your legs often if you must stand in one place for a long time.  Avoid heavy lifting.  Wear low-heeled shoes. Sit and stand up  straight.  You can have sex unless your doctor tells you not to. Relieving pain and discomfort  Wear a good support bra if your breasts are sore.  Take warm water baths (sitz baths) to soothe pain or discomfort caused by hemorrhoids. Use hemorrhoid cream if your doctor says it is okay.  Rest with your legs raised if you have leg cramps or low back pain.  If you have puffy, bulging veins (varicose veins) in your legs: ? Wear support hose or compression stockings as told by your doctor. ? Raise (elevate) your feet for 15 minutes, 3-4 times a day. ? Limit salt in your food. Prenatal care  Schedule your prenatal visits by the twelfth week of pregnancy.  Write down your questions. Take them to your prenatal visits.  Keep all your prenatal visits as told by your doctor. This is important. Safety  Wear your seat belt at all times when driving.  Make a list of emergency phone numbers. The list should include numbers for family, friends, the hospital, and police and fire departments. General instructions  Ask your doctor for a referral to a local prenatal class. Begin classes no later than at the start of month 6 of your pregnancy.  Ask for help if you need counseling or if you need help with nutrition. Your doctor can give you advice or tell you where to go for help.  Do not use hot tubs, steam   rooms, or saunas.  Do not douche or use tampons or scented sanitary pads.  Do not cross your legs for long periods of time.  Avoid all herbs and alcohol. Avoid drugs that are not approved by your doctor.  Do not use any tobacco products, including cigarettes, chewing tobacco, and electronic cigarettes. If you need help quitting, ask your doctor. You may get counseling or other support to help you quit.  Avoid cat litter boxes and soil used by cats. These carry germs that can cause birth defects in the baby and can cause a loss of your baby (miscarriage) or stillbirth.  Visit your dentist.  At home, brush your teeth with a soft toothbrush. Be gentle when you floss. Contact a doctor if:  You are dizzy.  You have mild cramps or pressure in your lower belly.  You have a nagging pain in your belly area.  You continue to feel sick to your stomach, you throw up, or you have watery poop (diarrhea).  You have a bad smelling fluid coming from your vagina.  You have pain when you pee (urinate).  You have increased puffiness (swelling) in your face, hands, legs, or ankles. Get help right away if:  You have a fever.  You are leaking fluid from your vagina.  You have spotting or bleeding from your vagina.  You have very bad belly cramping or pain.  You gain or lose weight rapidly.  You throw up blood. It may look like coffee grounds.  You are around people who have German measles, fifth disease, or chickenpox.  You have a very bad headache.  You have shortness of breath.  You have any kind of trauma, such as from a fall or a car accident. Summary  The first trimester of pregnancy is from week 1 until the end of week 13 (months 1 through 3).  To take care of yourself and your unborn baby, you will need to eat healthy meals, take medicines only if your doctor tells you to do so, and do activities that are safe for you and your baby.  Keep all follow-up visits as told by your doctor. This is important as your doctor will have to ensure that your baby is healthy and growing well. This information is not intended to replace advice given to you by your health care provider. Make sure you discuss any questions you have with your health care provider. Document Revised: 05/07/2018 Document Reviewed: 01/23/2016 Elsevier Patient Education  2020 [employer] Inc.  

## 2019-09-13 ENCOUNTER — Encounter: Payer: Self-pay | Admitting: Obstetrics

## 2019-09-13 LAB — CERVICOVAGINAL ANCILLARY ONLY
Chlamydia: NEGATIVE
Comment: NEGATIVE
Comment: NEGATIVE
Comment: NORMAL
Neisseria Gonorrhea: NEGATIVE
Trichomonas: NEGATIVE

## 2019-09-13 LAB — URINE CULTURE

## 2019-09-13 LAB — CYTOLOGY - PAP: Diagnosis: NEGATIVE

## 2019-09-14 ENCOUNTER — Other Ambulatory Visit: Payer: Self-pay | Admitting: Obstetrics

## 2019-09-14 DIAGNOSIS — R8271 Bacteriuria: Secondary | ICD-10-CM

## 2019-09-14 DIAGNOSIS — O99891 Other specified diseases and conditions complicating pregnancy: Secondary | ICD-10-CM

## 2019-09-14 DIAGNOSIS — O0990 Supervision of high risk pregnancy, unspecified, unspecified trimester: Secondary | ICD-10-CM

## 2019-09-14 NOTE — Progress Notes (Signed)
Labs reviewed. Patient has bacteruria with  Over 100,000 colonies of SA. Rx for Macrobid is appropriate, but there is no listed pharmacy.  Phone call to patient is not picked up, but voicemail is left for her to contact the office and give a pharmacy preference. Mirna Mires, CNM  09/14/2019 5:51 PM

## 2019-09-22 ENCOUNTER — Ambulatory Visit (INDEPENDENT_AMBULATORY_CARE_PROVIDER_SITE_OTHER): Payer: Medicaid Other

## 2019-09-22 ENCOUNTER — Encounter: Payer: Self-pay | Admitting: Certified Nurse Midwife

## 2019-09-22 ENCOUNTER — Other Ambulatory Visit: Payer: Self-pay

## 2019-09-22 ENCOUNTER — Other Ambulatory Visit: Payer: Self-pay | Admitting: Obstetrics

## 2019-09-22 ENCOUNTER — Ambulatory Visit (INDEPENDENT_AMBULATORY_CARE_PROVIDER_SITE_OTHER): Payer: Medicaid Other | Admitting: Certified Nurse Midwife

## 2019-09-22 VITALS — BP 100/80 | Wt 195.0 lb

## 2019-09-22 DIAGNOSIS — Z3A09 9 weeks gestation of pregnancy: Secondary | ICD-10-CM

## 2019-09-22 DIAGNOSIS — Z1379 Encounter for other screening for genetic and chromosomal anomalies: Secondary | ICD-10-CM

## 2019-09-22 DIAGNOSIS — Z3A10 10 weeks gestation of pregnancy: Secondary | ICD-10-CM | POA: Diagnosis not present

## 2019-09-22 DIAGNOSIS — O0991 Supervision of high risk pregnancy, unspecified, first trimester: Secondary | ICD-10-CM

## 2019-09-22 DIAGNOSIS — Z6835 Body mass index (BMI) 35.0-35.9, adult: Secondary | ICD-10-CM

## 2019-09-22 DIAGNOSIS — Z3480 Encounter for supervision of other normal pregnancy, unspecified trimester: Secondary | ICD-10-CM

## 2019-09-22 DIAGNOSIS — O99211 Obesity complicating pregnancy, first trimester: Secondary | ICD-10-CM

## 2019-09-22 DIAGNOSIS — N3000 Acute cystitis without hematuria: Secondary | ICD-10-CM

## 2019-09-22 DIAGNOSIS — O0990 Supervision of high risk pregnancy, unspecified, unspecified trimester: Secondary | ICD-10-CM

## 2019-09-22 DIAGNOSIS — O99210 Obesity complicating pregnancy, unspecified trimester: Secondary | ICD-10-CM

## 2019-09-22 MED ORDER — NITROFURANTOIN MONOHYD MACRO 100 MG PO CAPS: 100.0000 mg | ORAL_CAPSULE | Freq: Two times a day (BID) | ORAL | 0 refills | Status: DC

## 2019-09-22 NOTE — Progress Notes (Signed)
No concerns.rj 

## 2019-09-22 NOTE — Progress Notes (Signed)
ROB at 10wk5d: Not vomiting, but decreased appetite. Weight down 4# to 195# Urine culture grew out 100K Staph aureus-was advised had UTI and went to pharmacy to pick up RX but no RX there DAting scan today: CRL 10wk4day with FCA 165. Small intramural fibroid <2cm in size  FHTs heard with DT at 170BPM. FH about 4FB +SP  A: IUP at 10wk5d Staph aureus UTI  P: NOB labs, Inheritest and MaterniT 21 today RTO 2 weeks for 1 hour GTT ROB in 4 weeks RX for Macrobid BID x 7days sent to pharmacy. Increase water intake.

## 2019-09-23 LAB — RPR+RH+ABO+RUB AB+AB SCR+CB...
Antibody Screen: NEGATIVE
HIV Screen 4th Generation wRfx: NONREACTIVE
Hematocrit: 39.3 % (ref 34.0–46.6)
Hemoglobin: 13.2 g/dL (ref 11.1–15.9)
Hepatitis B Surface Ag: NEGATIVE
MCH: 31.8 pg (ref 26.6–33.0)
MCHC: 33.6 g/dL (ref 31.5–35.7)
MCV: 95 fL (ref 79–97)
Platelets: 281 10*3/uL (ref 150–450)
RBC: 4.15 x10E6/uL (ref 3.77–5.28)
RDW: 13.2 % (ref 11.7–15.4)
RPR Ser Ql: NONREACTIVE
Rh Factor: POSITIVE
Rubella Antibodies, IGG: 9.08 index (ref >0.99)
Varicella zoster IgG: 442 index (ref >165)
WBC: 12.2 10*3/uL — ABNORMAL HIGH (ref 3.4–10.8)

## 2019-09-24 ENCOUNTER — Encounter: Payer: Self-pay | Admitting: Certified Nurse Midwife

## 2019-09-27 LAB — MATERNIT 21 PLUS CORE, BLOOD
Fetal Fraction: 6
Result (T21): NEGATIVE
Trisomy 13 (Patau syndrome): NEGATIVE
Trisomy 18 (Edwards syndrome): NEGATIVE
Trisomy 21 (Down syndrome): NEGATIVE

## 2019-10-06 LAB — INHERITEST CORE(CF97,SMA,FRAX)

## 2019-10-08 ENCOUNTER — Other Ambulatory Visit: Payer: Self-pay

## 2019-10-08 ENCOUNTER — Other Ambulatory Visit: Payer: Medicaid Other

## 2019-10-08 ENCOUNTER — Encounter: Payer: Self-pay | Admitting: Obstetrics & Gynecology

## 2019-10-08 ENCOUNTER — Ambulatory Visit (INDEPENDENT_AMBULATORY_CARE_PROVIDER_SITE_OTHER): Payer: Medicaid Other | Admitting: Obstetrics & Gynecology

## 2019-10-08 VITALS — BP 122/80 | Wt 201.0 lb

## 2019-10-08 DIAGNOSIS — O99210 Obesity complicating pregnancy, unspecified trimester: Secondary | ICD-10-CM

## 2019-10-08 DIAGNOSIS — Z3A13 13 weeks gestation of pregnancy: Secondary | ICD-10-CM

## 2019-10-08 DIAGNOSIS — O0992 Supervision of high risk pregnancy, unspecified, second trimester: Secondary | ICD-10-CM

## 2019-10-08 DIAGNOSIS — O0991 Supervision of high risk pregnancy, unspecified, first trimester: Secondary | ICD-10-CM

## 2019-10-08 DIAGNOSIS — Z6835 Body mass index (BMI) 35.0-35.9, adult: Secondary | ICD-10-CM

## 2019-10-08 MED ORDER — NITROFURANTOIN MONOHYD MACRO 100 MG PO CAPS: 100.0000 mg | ORAL_CAPSULE | Freq: Two times a day (BID) | ORAL | 0 refills | Status: DC

## 2019-10-08 NOTE — Patient Instructions (Signed)

## 2019-10-08 NOTE — Progress Notes (Signed)
°  Subjective  Fetal Movement? yes Contractions? no Leaking Fluid? no Vaginal Bleeding? no Nausea? Mild  Objective  BP 122/80    Wt 201 lb (91.2 kg)    LMP 07/09/2019 Comment: normal   BMI 36.76 kg/m  General: NAD Pumonary: no increased work of breathing Abdomen: gravid, non-tender Extremities: no edema Psychiatric: mood appropriate, affect full  Assessment  29 y.o. G2P1001 at [redacted]w[redacted]d by  04/14/2020, by Last Menstrual Period presenting for routine prenatal visit  Plan   Problem List Items Addressed This Visit    None    Visit Diagnoses    [redacted] weeks gestation of pregnancy    -  Primary   Supervision of high risk pregnancy in second trimester       Obesity in pregnancy          pregnancy2  Problems (from 07/09/19 to present)    No problems associated with this episode.     Still needs to take ABX for U Cult based UTI, to pick up soon  PNV samples given, to message [place] as to which brand more tolerable for Rx  Annamarie Major, MD, [facility], [facility] 10/08/2019  3:30 PM

## 2019-10-09 LAB — GLUCOSE, 1 HOUR GESTATIONAL: Gestational Diabetes Screen: 99 mg/dL (ref 65–139)

## 2019-10-20 ENCOUNTER — Encounter: Payer: Self-pay | Admitting: Obstetrics and Gynecology

## 2019-10-20 ENCOUNTER — Other Ambulatory Visit: Payer: Self-pay

## 2019-10-20 ENCOUNTER — Ambulatory Visit (INDEPENDENT_AMBULATORY_CARE_PROVIDER_SITE_OTHER): Payer: Medicaid Other | Admitting: Obstetrics and Gynecology

## 2019-10-20 VITALS — BP 126/74 | Wt 196.0 lb

## 2019-10-20 DIAGNOSIS — Z3A14 14 weeks gestation of pregnancy: Secondary | ICD-10-CM

## 2019-10-20 DIAGNOSIS — Z3482 Encounter for supervision of other normal pregnancy, second trimester: Secondary | ICD-10-CM

## 2019-10-20 NOTE — Progress Notes (Signed)
  Routine Prenatal Care Visit  Subjective  Kristin Shepard is a 29 y.o. G2P1001 at [redacted]w[redacted]d being seen today for ongoing prenatal care.  She is currently monitored for the following issues for this high-risk pregnancy and has Supervision of other normal pregnancy, antepartum on their problem list.  ----------------------------------------------------------------------------------- Patient reports no complaints.   Contractions: Not present. Vag. Bleeding: None.   . Leaking Fluid denies.  ----------------------------------------------------------------------------------- The following portions of the patient's history were reviewed and updated as appropriate: allergies, current medications, past family history, past medical history, past social history, past surgical history and problem list. Problem list updated.  Objective  Blood pressure 126/74, weight 196 lb (88.9 kg), last menstrual period 07/09/2019. Pregravid weight 199 lb (90.3 kg) Total Weight Gain -3 lb (-1.361 kg) Urinalysis: Urine Protein    Urine Glucose    Fetal Status: Fetal Heart Rate (bpm): 153         General:  Alert, oriented and cooperative. Patient is in no acute distress.  Skin: Skin is warm and dry. No rash noted.   Cardiovascular: Normal heart rate noted  Respiratory: Normal respiratory effort, no problems with respiration noted  Abdomen: Soft, gravid, appropriate for gestational age. Pain/Pressure: Absent     Pelvic:  Cervical exam deferred        Extremities: Normal range of motion.     Mental Status: Normal mood and affect. Normal behavior. Normal judgment and thought content.   Assessment   29 y.o. G2P1001 at [redacted]w[redacted]d by  04/14/2020, by Last Menstrual Period presenting for routine prenatal visit  Plan   pregnancy2  Problems (from 07/09/19 to present)    No problems associated with this episode.       Preterm labor symptoms and general obstetric precautions including but not limited to vaginal bleeding,  contractions, leaking of fluid and fetal movement were reviewed in detail with the patient. Please refer to After Visit Summary for other counseling recommendations.   Return in about 4 weeks (around 11/17/2019) for Anatomy u/s and routine prenatal.  Thomasene Mohair, MD, [facility], [facility] 10/20/2019 3:44 PM

## 2019-11-03 ENCOUNTER — Encounter: Payer: Medicaid Other | Admitting: Obstetrics & Gynecology

## 2019-11-17 ENCOUNTER — Encounter: Payer: Self-pay | Admitting: Advanced Practice Midwife

## 2019-11-17 ENCOUNTER — Ambulatory Visit (INDEPENDENT_AMBULATORY_CARE_PROVIDER_SITE_OTHER): Payer: Medicaid Other | Admitting: Advanced Practice Midwife

## 2019-11-17 ENCOUNTER — Ambulatory Visit (INDEPENDENT_AMBULATORY_CARE_PROVIDER_SITE_OTHER): Payer: Medicaid Other

## 2019-11-17 ENCOUNTER — Other Ambulatory Visit: Payer: Self-pay

## 2019-11-17 VITALS — BP 122/84 | Wt 196.0 lb

## 2019-11-17 DIAGNOSIS — Z3A14 14 weeks gestation of pregnancy: Secondary | ICD-10-CM

## 2019-11-17 DIAGNOSIS — Z3A18 18 weeks gestation of pregnancy: Secondary | ICD-10-CM

## 2019-11-17 DIAGNOSIS — Z3482 Encounter for supervision of other normal pregnancy, second trimester: Secondary | ICD-10-CM | POA: Diagnosis not present

## 2019-11-17 NOTE — Progress Notes (Signed)
  Routine Prenatal Care Visit  Subjective  Kristin Shepard is a 29 y.o. G2P1001 at [redacted]w[redacted]d being seen today for ongoing prenatal care.  She is currently monitored for the following issues for this low-risk pregnancy and has Supervision of other normal pregnancy, antepartum on their problem list.  ----------------------------------------------------------------------------------- Patient reports no complaints.  We reviewed results of anatomy scan. Contractions: Not present. Vag. Bleeding: None.  Movement: Present. Leaking Fluid denies.  ----------------------------------------------------------------------------------- The following portions of the patient's history were reviewed and updated as appropriate: allergies, current medications, past family history, past medical history, past social history, past surgical history and problem list. Problem list updated.  Objective  Blood pressure 122/84, weight 196 lb (88.9 kg), last menstrual period 07/09/2019. Pregravid weight 199 lb (90.3 kg) Total Weight Gain -3 lb (-1.361 kg) Urinalysis: Urine Protein    Urine Glucose    Fetal Status: Fetal Heart Rate (bpm): 147 Fundal Height: 18 cm Movement: Present      Anatomy scan: complete, normal, female, cephalic, placenta posterior  General:  Alert, oriented and cooperative. Patient is in no acute distress.  Skin: Skin is warm and dry. No rash noted.   Cardiovascular: Normal heart rate noted  Respiratory: Normal respiratory effort, no problems with respiration noted  Abdomen: Soft, gravid, appropriate for gestational age. Pain/Pressure: Absent     Pelvic:  Cervical exam deferred        Extremities: Normal range of motion.     Mental Status: Normal mood and affect. Normal behavior. Normal judgment and thought content.   Assessment   29 y.o. G2P1001 at [redacted]w[redacted]d by  04/14/2020, by Last Menstrual Period presenting for routine prenatal visit  Plan      Preterm labor symptoms and general obstetric  precautions including but not limited to vaginal bleeding, contractions, leaking of fluid and fetal movement were reviewed in detail with the patient.    Return in about 4 weeks (around 12/15/2019) for rob.  Tresea Mall, CNM 11/17/2019 3:03 PM

## 2019-11-17 NOTE — Progress Notes (Signed)
Anatomy scan today no vb. No lof.,

## 2019-12-15 ENCOUNTER — Encounter: Payer: Self-pay | Admitting: Advanced Practice Midwife

## 2019-12-15 ENCOUNTER — Other Ambulatory Visit: Payer: Self-pay

## 2019-12-15 ENCOUNTER — Ambulatory Visit (INDEPENDENT_AMBULATORY_CARE_PROVIDER_SITE_OTHER): Payer: Medicaid Other | Admitting: Advanced Practice Midwife

## 2019-12-15 VITALS — BP 100/60 | Wt 194.0 lb

## 2019-12-15 DIAGNOSIS — Z3480 Encounter for supervision of other normal pregnancy, unspecified trimester: Secondary | ICD-10-CM

## 2019-12-15 DIAGNOSIS — Z113 Encounter for screening for infections with a predominantly sexual mode of transmission: Secondary | ICD-10-CM

## 2019-12-15 DIAGNOSIS — Z131 Encounter for screening for diabetes mellitus: Secondary | ICD-10-CM

## 2019-12-15 DIAGNOSIS — Z3A22 22 weeks gestation of pregnancy: Secondary | ICD-10-CM

## 2019-12-15 DIAGNOSIS — Z130 Encounter for screening for diseases of the blood and blood-forming organs and certain disorders involving the immune mechanism: Secondary | ICD-10-CM

## 2019-12-15 NOTE — Progress Notes (Signed)
Routine Prenatal Care Visit  Subjective  Kristin Shepard is a 29 y.o. G2P1001 at [redacted]w[redacted]d being seen today for ongoing prenatal care.  She is currently monitored for the following issues for this low-risk pregnancy and has Supervision of other normal pregnancy, antepartum on their problem list.  ----------------------------------------------------------------------------------- Patient reports no complaints.   Contractions: Not present. Vag. Bleeding: None.  Movement: Present. Leaking Fluid denies.  ----------------------------------------------------------------------------------- The following portions of the patient's history were reviewed and updated as appropriate: allergies, current medications, past family history, past medical history, past social history, past surgical history and problem list. Problem list updated.  Objective  Blood pressure 100/60, weight 194 lb (88 kg), last menstrual period 07/09/2019. Pregravid weight 199 lb (90.3 kg) Total Weight Gain -5 lb (-2.268 kg) Urinalysis: Urine Protein    Urine Glucose    Fetal Status: Fetal Heart Rate (bpm): 148 Fundal Height: 23 cm Movement: Present     General:  Alert, oriented and cooperative. Patient is in no acute distress.  Skin: Skin is warm and dry. No rash noted.   Cardiovascular: Normal heart rate noted  Respiratory: Normal respiratory effort, no problems with respiration noted  Abdomen: Soft, gravid, appropriate for gestational age. Pain/Pressure: Absent     Pelvic:  Cervical exam deferred        Extremities: Normal range of motion.  Edema: None  Mental Status: Normal mood and affect. Normal behavior. Normal judgment and thought content.   Assessment   29 y.o. G2P1001 at [redacted]w[redacted]d by  04/14/2020, by Last Menstrual Period presenting for routine prenatal visit  Plan   pregnancy2  Problems (from 07/09/19 to present)    Problem Noted Resolved   Supervision of other normal pregnancy, antepartum 09/10/2019 by Mirna Mires,  CNM No   Overview Addendum 10/06/2019 10:03 PM by Mirna Mires, CNM     Clinic [facility] Prenatal Labs  Dating LMP=10wk [place]. Right intramural fibroid seen Blood type:   O POS  Genetic Screen 1 Screen:    AFP:     Quad:     NIPS:MaternTi 21 was negative. inheritest negative Antibody: neg  Anatomic [place]  Rubella:  RI  GTT Early:               Third trimester:  RPR:   nonreactive  Flu vaccine  HBsAg:   negative  TDaP vaccine                                               Rhogam: HIV:   non reactive  Baby Food              undecided                                 GBS: (For PCN allergy, check sensitivities)  Contraception  LOV:[id number] 8/13  Circumcision    Pediatrician    Support Person  FOB Eddie Candle             Previous Version       Preterm labor symptoms and general obstetric precautions including but not limited to vaginal bleeding, contractions, leaking of fluid and fetal movement were reviewed in detail with the patient.   Return in about 4 weeks (around 01/12/2020) for 28 wk labs and rob.  Tresea Mall, CNM 12/15/2019 4:52 PM

## 2020-01-14 ENCOUNTER — Other Ambulatory Visit: Payer: Self-pay

## 2020-01-14 ENCOUNTER — Ambulatory Visit (INDEPENDENT_AMBULATORY_CARE_PROVIDER_SITE_OTHER): Payer: Medicaid Other | Admitting: Obstetrics

## 2020-01-14 ENCOUNTER — Other Ambulatory Visit: Payer: Medicaid Other

## 2020-01-14 VITALS — BP 100/50 | Wt 192.0 lb

## 2020-01-14 DIAGNOSIS — Z3480 Encounter for supervision of other normal pregnancy, unspecified trimester: Secondary | ICD-10-CM

## 2020-01-14 DIAGNOSIS — Z3A27 27 weeks gestation of pregnancy: Secondary | ICD-10-CM

## 2020-01-14 NOTE — Progress Notes (Signed)
Routine Prenatal Care Visit  Subjective  Kristin Shepard is a 29 y.o. G2P1001 at [redacted]w[redacted]d being seen today for ongoing prenatal care.  She is currently monitored for the following issues for this low-risk pregnancy and has Supervision of other normal pregnancy, antepartum on their problem list.  ----------------------------------------------------------------------------------- Patient reports no complaints.   Contractions: Not present. Vag. Bleeding: None.  Movement: Present. Leaking Fluid denies.  ----------------------------------------------------------------------------------- The following portions of the patient's history were reviewed and updated as appropriate: allergies, current medications, past family history, past medical history, past social history, past surgical history and problem list. Problem list updated.  Objective  Blood pressure (!) 100/50, weight 192 lb (87.1 kg), last menstrual period 07/09/2019. Pregravid weight 199 lb (90.3 kg) Total Weight Gain -7 lb (-3.175 kg) Urinalysis: Urine Protein    Urine Glucose    Fetal Status:     Movement: Present     General:  Alert, oriented and cooperative. Patient is in no acute distress.  Skin: Skin is warm and dry. No rash noted.   Cardiovascular: Normal heart rate noted  Respiratory: Normal respiratory effort, no problems with respiration noted  Abdomen: Soft, gravid, appropriate for gestational age. Pain/Pressure: Absent     Pelvic:  Cervical exam deferred        Extremities: Normal range of motion.  Edema: None  Mental Status: Normal mood and affect. Normal behavior. Normal judgment and thought content.   Assessment   29 y.o. G2P1001 at [redacted]w[redacted]d by  04/14/2020, by Last Menstrual Period presenting for routine prenatal visit  Plan   pregnancy2  Problems (from 07/09/19 to present)    Problem Noted Resolved   Supervision of other normal pregnancy, antepartum 09/10/2019 by Mirna Mires, CNM No   Overview Addendum 10/06/2019  10:03 PM by Mirna Mires, CNM     [facility]inic [facility] Prenatal Labs  Dating LMP=10wk [place]. Right intramural fibroid seen Blood type:   O POS  Genetic Screen 1 Screen:    AFP:     Quad:     NIPS:MaternTi 21 was negative. inheritest negative Antibody: neg  Anatomic [place]  Rubella:  RI  GTT Early:               Third trimester:  RPR:   nonreactive  Flu vaccine  HBsAg:   negative  TDaP vaccine                                               Rhogam: HIV:   non reactive  Baby Food              undecided                                 GBS: (For PCN allergy, check sensitivities)  Contraception  ENI:[id number] 8/13  Circumcision    Pediatrician    Support Person  FOB Eddie Candle             Previous Version       Preterm labor symptoms and general obstetric precautions including but not limited to vaginal bleeding, contractions, leaking of fluid and fetal movement were reviewed in detail with the patient. Please refer to After Visit Summary for other counseling recommendations.  She will have her lab work drawn next week.  Return in about 2 weeks (  around 01/28/2020) for return OB, and her 28 week labs.Mirna Mires, CNM  01/14/2020 4:05 PM

## 2020-01-14 NOTE — Progress Notes (Signed)
No concerns.rj 

## 2020-01-18 ENCOUNTER — Other Ambulatory Visit: Payer: Medicaid Other

## 2020-01-18 ENCOUNTER — Other Ambulatory Visit: Payer: Self-pay

## 2020-01-18 DIAGNOSIS — Z113 Encounter for screening for infections with a predominantly sexual mode of transmission: Secondary | ICD-10-CM

## 2020-01-18 DIAGNOSIS — Z130 Encounter for screening for diseases of the blood and blood-forming organs and certain disorders involving the immune mechanism: Secondary | ICD-10-CM

## 2020-01-18 DIAGNOSIS — Z3480 Encounter for supervision of other normal pregnancy, unspecified trimester: Secondary | ICD-10-CM

## 2020-01-18 DIAGNOSIS — Z131 Encounter for screening for diabetes mellitus: Secondary | ICD-10-CM

## 2020-01-19 LAB — 28 WEEK RH+PANEL
Basophils Absolute: 0 10*3/uL (ref 0.0–0.2)
Basos: 0 %
EOS (ABSOLUTE): 0.1 10*3/uL (ref 0.0–0.4)
Eos: 1 %
Gestational Diabetes Screen: 85 mg/dL (ref 65–139)
HIV Screen 4th Generation wRfx: NONREACTIVE
Hematocrit: 34.7 % (ref 34.0–46.6)
Hemoglobin: 11.4 g/dL (ref 11.1–15.9)
Immature Grans (Abs): 0 10*3/uL (ref 0.0–0.1)
Immature Granulocytes: 0 %
Lymphocytes Absolute: 2.2 10*3/uL (ref 0.7–3.1)
Lymphs: 22 %
MCH: 32 pg (ref 26.6–33.0)
MCHC: 32.9 g/dL (ref 31.5–35.7)
MCV: 98 fL — ABNORMAL HIGH (ref 79–97)
Monocytes Absolute: 0.7 10*3/uL (ref 0.1–0.9)
Monocytes: 7 %
Neutrophils Absolute: 6.8 10*3/uL (ref 1.4–7.0)
Neutrophils: 70 %
Platelets: 216 10*3/uL (ref 150–450)
RBC: 3.56 x10E6/uL — ABNORMAL LOW (ref 3.77–5.28)
RDW: 11.4 % — ABNORMAL LOW (ref 11.7–15.4)
RPR Ser Ql: NONREACTIVE
WBC: 9.9 10*3/uL (ref 3.4–10.8)

## 2020-01-27 ENCOUNTER — Encounter: Payer: Medicaid Other | Admitting: Obstetrics & Gynecology

## 2020-01-29 NOTE — L&D Delivery Note (Signed)
Vaginal Delivery Note  Spontaneous delivery of live viable female infant from the LOA position through an intact perineum with restitution to LOT. Delivery of anterior right shoulder with gentle downward guidance followed by delivery of the left posterior shoulder with gentle upward guidance. Body followed spontaneously. Infant placed on maternal chest. Nursery present and helped with neonatal resuscitation and evaluation. Cord clamped and cut after two minute. Cord blood collected. Placenta delivered spontaneously and intact with a 3 vessel cord.  No lacerations were noted on examination. Uterus firm and below umbilicus at the end of the delivery.  Mom and baby recovering in stable condition. Sponge and needle counts were correct at the end of the delivery.  APGARS: 1 minute: 8 5 minutes: 9 Weight: pending No epidural placed  [name], CNM, MSN [facility], [facility] 04/17/20 10:37 AM

## 2020-02-08 ENCOUNTER — Encounter: Payer: Self-pay | Admitting: Obstetrics & Gynecology

## 2020-02-08 ENCOUNTER — Ambulatory Visit (INDEPENDENT_AMBULATORY_CARE_PROVIDER_SITE_OTHER): Payer: Medicaid Other | Admitting: Obstetrics & Gynecology

## 2020-02-08 ENCOUNTER — Other Ambulatory Visit: Payer: Self-pay

## 2020-02-08 VITALS — BP 90/60 | Wt 188.0 lb

## 2020-02-08 DIAGNOSIS — Z3483 Encounter for supervision of other normal pregnancy, third trimester: Secondary | ICD-10-CM

## 2020-02-08 DIAGNOSIS — Z3A30 30 weeks gestation of pregnancy: Secondary | ICD-10-CM

## 2020-02-08 LAB — POCT URINALYSIS DIPSTICK OB: Glucose, UA: NEGATIVE

## 2020-02-08 NOTE — Patient Instructions (Signed)

## 2020-02-08 NOTE — Progress Notes (Signed)
  Subjective  Fetal Movement? yes Contractions? no Leaking Fluid? no Vaginal Bleeding? no Occas nausea Recent Covid resp inf and pos test, better now  Objective  BP 90/60   Wt 188 lb (85.3 kg)   LMP 07/09/2019 Comment: normal  BMI 34.39 kg/m  General: NAD Pumonary: no increased work of breathing Abdomen: gravid, non-tender Extremities: no edema Psychiatric: mood appropriate, affect full  Assessment  30 y.o. G2P1001 at [redacted]w[redacted]d by  04/14/2020, by Last Menstrual Period presenting for routine prenatal visit  Plan   Problem List Items Addressed This Visit      Other   Supervision of other normal pregnancy, antepartum    Other Visit Diagnoses    [redacted] weeks gestation of pregnancy    -  Primary   Relevant Orders   POC Urinalysis Dipstick OB (Completed)      pregnancy2  Problems (from 07/09/19 to present)    Problem Noted Resolved   Supervision of other normal pregnancy, antepartum 09/10/2019 by Mirna Mires, CNM No   Overview Addendum 02/08/2020  3:48 PM by Nadara Mustard, MD     [facility]inic [facility] Prenatal Labs  Dating LMP=10wk [place]. Right intramural fibroid seen Blood type: O/Positive/-- (08/25 1403) O POS  Genetic Screen NIPS:MaternTi 21 was negative XY.  Inheritest negative Antibody:Negative (08/25 1403)neg  Anatomic [place] [facility] nml Rubella: 9.08 (08/25 1403)RI  GTT        Third trimester:  RPR: Non Reactive (12/21 1402) nonreactive  Flu vaccine decl HBsAg: Negative (08/25 1403) negative  TDaP vaccine                  Rhogam:n/a HIV: Non Reactive (12/21 1402) non reactive  Baby Food Bottle                         GBS: (For PCN allergy, check sensitivities)  Contraception Unsure 02/08/20 [id number] 8/13  Circumcision    Pediatrician    Support Person  FOB Eddie Candle             Previous Version     Declines flu shot  Desires to bottle feed      Counseled on breast feeding advantages  Uncertain contraception, discussed planning strategies for postpartum  TDaP nv     Annamarie Major, MD, [facility], [facility] 02/08/2020  3:48 PM

## 2020-02-22 ENCOUNTER — Ambulatory Visit (INDEPENDENT_AMBULATORY_CARE_PROVIDER_SITE_OTHER): Payer: Medicaid Other | Admitting: Advanced Practice Midwife

## 2020-02-22 ENCOUNTER — Encounter: Payer: Self-pay | Admitting: Advanced Practice Midwife

## 2020-02-22 ENCOUNTER — Other Ambulatory Visit: Payer: Self-pay

## 2020-02-22 VITALS — BP 100/60 | Wt 193.0 lb

## 2020-02-22 DIAGNOSIS — Z3A32 32 weeks gestation of pregnancy: Secondary | ICD-10-CM

## 2020-02-22 DIAGNOSIS — Z3483 Encounter for supervision of other normal pregnancy, third trimester: Secondary | ICD-10-CM

## 2020-02-22 NOTE — Progress Notes (Signed)
  Routine Prenatal Care Visit  Subjective  Nonna Nogueira is a 30 y.o. G2P1001 at [redacted]w[redacted]d being seen today for ongoing prenatal care.  She is currently monitored for the following issues for this low-risk pregnancy and has Supervision of other normal pregnancy, antepartum on their problem list.  ----------------------------------------------------------------------------------- Patient reports no complaints.   Contractions: Not present. Vag. Bleeding: None.  Movement: Present. Leaking Fluid denies.  ----------------------------------------------------------------------------------- The following portions of the patient's history were reviewed and updated as appropriate: allergies, current medications, past family history, past medical history, past social history, past surgical history and problem list. Problem list updated.  Objective  Blood pressure 100/60, weight 193 lb (87.5 kg), last menstrual period 07/09/2019. Pregravid weight 199 lb (90.3 kg) Total Weight Gain -6 lb (-2.722 kg) Urinalysis: Urine Protein    Urine Glucose    Fetal Status: Fetal Heart Rate (bpm): 137 Fundal Height: 32 cm Movement: Present     General:  Alert, oriented and cooperative. Patient is in no acute distress.  Skin: Skin is warm and dry. No rash noted.   Cardiovascular: Normal heart rate noted  Respiratory: Normal respiratory effort, no problems with respiration noted  Abdomen: Soft, gravid, appropriate for gestational age. Pain/Pressure: Present     Pelvic:  Cervical exam deferred        Extremities: Normal range of motion.  Edema: None  Mental Status: Normal mood and affect. Normal behavior. Normal judgment and thought content.   Assessment   30 y.o. G2P1001 at [redacted]w[redacted]d by  04/14/2020, by Last Menstrual Period presenting for routine prenatal visit  Plan   pregnancy2  Problems (from 07/09/19 to present)    Problem Noted Resolved   Supervision of other normal pregnancy, antepartum 09/10/2019 by Mirna Mires, CNM No   Overview Addendum 02/08/2020  3:48 PM by Nadara Mustard, MD     Clinic [facility] Prenatal Labs  Dating LMP=10wk [place]. Right intramural fibroid seen Blood type: O/Positive/-- (08/25 1403) O POS  Genetic Screen NIPS:MaternTi 21 was negative XY.  Inheritest negative Antibody:Negative (08/25 1403)neg  Anatomic [place] [facility] nml Rubella: 9.08 (08/25 1403)RI  GTT        Third trimester:  RPR: Non Reactive (12/21 1402) nonreactive  Flu vaccine decl HBsAg: Negative (08/25 1403) negative  TDaP vaccine                  Rhogam:n/a HIV: Non Reactive (12/21 1402) non reactive  Baby Food Bottle                         GBS: (For PCN allergy, check sensitivities)  Contraception Unsure 02/08/20 [id number] 8/13  Circumcision    Pediatrician    Support Person  FOB Eddie Candle             Previous Version       Preterm labor symptoms and general obstetric precautions including but not limited to vaginal bleeding, contractions, leaking of fluid and fetal movement were reviewed in detail with the patient. Please refer to After Visit Summary for other counseling recommendations.   Return for scheduled appointments.  Tresea Mall, CNM 02/22/2020 3:11 PM

## 2020-03-07 ENCOUNTER — Other Ambulatory Visit: Payer: Self-pay

## 2020-03-07 ENCOUNTER — Ambulatory Visit (INDEPENDENT_AMBULATORY_CARE_PROVIDER_SITE_OTHER): Payer: Medicaid Other | Admitting: Obstetrics

## 2020-03-07 VITALS — BP 118/74 | Wt 191.0 lb

## 2020-03-07 DIAGNOSIS — Z3A34 34 weeks gestation of pregnancy: Secondary | ICD-10-CM

## 2020-03-07 DIAGNOSIS — Z3483 Encounter for supervision of other normal pregnancy, third trimester: Secondary | ICD-10-CM

## 2020-03-07 DIAGNOSIS — Z3A36 36 weeks gestation of pregnancy: Secondary | ICD-10-CM

## 2020-03-07 NOTE — Progress Notes (Signed)
  Routine Prenatal Care Visit  Subjective  Kristin Shepard is a 30 y.o. G2P1001 at [redacted]w[redacted]d being seen today for ongoing prenatal care.  She is currently monitored for the following issues for this low-risk pregnancy and has Supervision of other normal pregnancy, antepartum on their problem list.  ----------------------------------------------------------------------------------- Patient reports no complaints.   Contractions: Not present. Vag. Bleeding: None.  Movement: Present. Leaking Fluid denies.  ----------------------------------------------------------------------------------- The following portions of the patient's history were reviewed and updated as appropriate: allergies, current medications, past family history, past medical history, past social history, past surgical history and problem list. Problem list updated.  Objective  Blood pressure 118/74, weight 191 lb (86.6 kg), last menstrual period 07/09/2019. Pregravid weight 199 lb (90.3 kg) Total Weight Gain -8 lb (-3.629 kg) Urinalysis: Urine Protein    Urine Glucose    Fetal Status:     Movement: Present     General:  Alert, oriented and cooperative. Patient is in no acute distress.  Skin: Skin is warm and dry. No rash noted.   Cardiovascular: Normal heart rate noted  Respiratory: Normal respiratory effort, no problems with respiration noted  Abdomen: Soft, gravid, appropriate for gestational age. Pain/Pressure: Absent     Pelvic:  Cervical exam deferred        Extremities: Normal range of motion.     Mental Status: Normal mood and affect. Normal behavior. Normal judgment and thought content.   Assessment   30 y.o. G2P1001 at [redacted]w[redacted]d by  04/14/2020, by Last Menstrual Period presenting for routine prenatal visit  Plan   pregnancy2  Problems (from 07/09/19 to present)    Problem Noted Resolved   Supervision of other normal pregnancy, antepartum 09/10/2019 by Mirna Mires, CNM No   Overview Addendum 02/08/2020  3:48 PM by  Nadara Mustard, MD     Clinic [facility] Prenatal Labs  Dating LMP=10wk [place]. Right intramural fibroid seen Blood type: O/Positive/-- (08/25 1403) O POS  Genetic Screen NIPS:MaternTi 21 was negative XY.  Inheritest negative Antibody:Negative (08/25 1403)neg  Anatomic [place] [facility] nml Rubella: 9.08 (08/25 1403)RI  GTT        Third trimester:  RPR: Non Reactive (12/21 1402) nonreactive  Flu vaccine decl HBsAg: Negative (08/25 1403) negative  TDaP vaccine                  Rhogam:n/a HIV: Non Reactive (12/21 1402) non reactive  Baby Food Bottle                         GBS: (For PCN allergy, check sensitivities)  Contraception Unsure 02/08/20 [id number] 8/13  Circumcision    Pediatrician    Support Person  FOB Eddie Candle             Previous Version       Preterm labor symptoms and general obstetric precautions including but not limited to vaginal bleeding, contractions, leaking of fluid and fetal movement were reviewed in detail with the patient. Please refer to After Visit Summary for other counseling recommendations.  GBS next visit discussed. jShe continues to smoke cigars- strong smell in the room. Tobacco cessation addressed. Offered trial of Wellbutrin- she wants to think about this. Risks of smoking to her baby described.  Return in about 2 weeks (around 03/21/2020) for return OB, GBS culture.  Mirna Mires, CNM  03/07/2020 4:38 PM

## 2020-03-07 NOTE — Progress Notes (Signed)
No vb. No lof.  

## 2020-03-21 ENCOUNTER — Ambulatory Visit (INDEPENDENT_AMBULATORY_CARE_PROVIDER_SITE_OTHER): Payer: Medicaid Other | Admitting: Obstetrics & Gynecology

## 2020-03-21 ENCOUNTER — Other Ambulatory Visit: Payer: Self-pay

## 2020-03-21 ENCOUNTER — Other Ambulatory Visit (HOSPITAL_COMMUNITY)
Admission: RE | Admit: 2020-03-21 | Discharge: 2020-03-21 | Disposition: A | Discharge status: Home / Self Care | Payer: Medicaid Other | Source: Ambulatory Visit | Attending: Obstetrics & Gynecology | Admitting: Obstetrics & Gynecology

## 2020-03-21 ENCOUNTER — Encounter: Payer: Self-pay | Admitting: Obstetrics & Gynecology

## 2020-03-21 VITALS — BP 120/80 | Wt 195.0 lb

## 2020-03-21 DIAGNOSIS — Z113 Encounter for screening for infections with a predominantly sexual mode of transmission: Secondary | ICD-10-CM

## 2020-03-21 DIAGNOSIS — Z3483 Encounter for supervision of other normal pregnancy, third trimester: Secondary | ICD-10-CM

## 2020-03-21 DIAGNOSIS — Z23 Encounter for immunization: Secondary | ICD-10-CM | POA: Diagnosis not present

## 2020-03-21 DIAGNOSIS — Z3A36 36 weeks gestation of pregnancy: Secondary | ICD-10-CM

## 2020-03-21 DIAGNOSIS — Z3685 Encounter for antenatal screening for Streptococcus B: Secondary | ICD-10-CM

## 2020-03-21 DIAGNOSIS — Z3480 Encounter for supervision of other normal pregnancy, unspecified trimester: Secondary | ICD-10-CM

## 2020-03-21 LAB — POCT URINALYSIS DIPSTICK OB
Glucose, UA: NEGATIVE
POC,PROTEIN,UA: NEGATIVE

## 2020-03-21 NOTE — Patient Instructions (Signed)

## 2020-03-21 NOTE — Progress Notes (Signed)
  Subjective  Fetal Movement? yes Contractions? no Leaking Fluid? no Vaginal Bleeding? no  Objective  BP 120/80   Wt 195 lb (88.5 kg)   LMP 07/09/2019 Comment: normal  BMI 35.67 kg/m  General: NAD Pumonary: no increased work of breathing Abdomen: gravid, non-tender Extremities: no edema Psychiatric: mood appropriate, affect full  Assessment  30 y.o. G2P1001 at [redacted]w[redacted]d by  04/14/2020, by Last Menstrual Period presenting for routine prenatal visit  Plan   Problem List Items Addressed This Visit    Visit Diagnoses    [redacted] weeks gestation of pregnancy      Antenatal screening for streptococcus B       Relevant Orders   Culture, beta strep (group b only)   Encounter for supervision of other normal pregnancy in third trimester       Screen for STD (sexually transmitted disease)       Relevant Orders   GC/Chlamydia probe amp ([place])not at [facility]    TDaP today GBS today PNV, [facility] STD screen (vag only )today per her request  pregnancy2  Problems (from 07/09/19 to present)    Problem Noted Resolved   Supervision of other normal pregnancy, antepartum 09/10/2019 by Mirna Mires, CNM No   Overview Addendum 02/08/2020  3:48 PM by Nadara Mustard, MD     [facility]inic [facility] Prenatal Labs  Dating LMP=10wk [place]. Right intramural fibroid seen Blood type: O/Positive/-- (08/25 1403) O POS  Genetic Screen NIPS:MaternTi 21 was negative XY.  Inheritest negative Antibody:Negative (08/25 1403)neg  Anatomic [place] [facility] nml Rubella: 9.08 (08/25 1403)RI  GTT        Third trimester:  RPR: Non Reactive (12/21 1402) nonreactive  Flu vaccine decl HBsAg: Negative (08/25 1403) negative  TDaP vaccine     today             Rhogam:n/a HIV: Non Reactive (12/21 1402) non reactive  Baby Food Bottle                         GBS: (For PCN allergy, check sensitivities)  Contraception Unsure 02/08/20 [id number] 8/13  Circumcision    Pediatrician    Support Person  FOB Leanna Sato, MD,  [facility], [facility] 03/21/2020  3:38 PM

## 2020-03-21 NOTE — Addendum Note (Signed)
Addended by: Cornelius Moras D on: 03/21/2020 03:45 PM   Modules accepted: Orders

## 2020-03-23 LAB — GC/CHLAMYDIA PROBE AMP (~~LOC~~) NOT AT ARMC
Chlamydia: NEGATIVE
Comment: NEGATIVE
Comment: NORMAL
Neisseria Gonorrhea: NEGATIVE

## 2020-03-24 LAB — CULTURE, BETA STREP (GROUP B ONLY): Strep Gp B Culture: POSITIVE — AB

## 2020-03-28 ENCOUNTER — Other Ambulatory Visit: Payer: Self-pay

## 2020-03-28 ENCOUNTER — Ambulatory Visit (INDEPENDENT_AMBULATORY_CARE_PROVIDER_SITE_OTHER): Payer: Medicaid Other | Admitting: Obstetrics and Gynecology

## 2020-03-28 VITALS — BP 100/60 | Wt 197.0 lb

## 2020-03-28 DIAGNOSIS — Z3480 Encounter for supervision of other normal pregnancy, unspecified trimester: Secondary | ICD-10-CM

## 2020-03-28 DIAGNOSIS — Z3A37 37 weeks gestation of pregnancy: Secondary | ICD-10-CM

## 2020-03-28 NOTE — Progress Notes (Signed)
    Routine Prenatal Care Visit  Subjective  Kristin Shepard is a 30 y.o. G2P1001 at [redacted]w[redacted]d being seen today for ongoing prenatal care.  She is currently monitored for the following issues for this low-risk pregnancy and has Supervision of other normal pregnancy, antepartum on their problem list.  ----------------------------------------------------------------------------------- Patient reports no complaints.   Contractions: Not present. Vag. Bleeding: None.  Movement: Present. Denies leaking of fluid.  ----------------------------------------------------------------------------------- The following portions of the patient's history were reviewed and updated as appropriate: allergies, current medications, past family history, past medical history, past social history, past surgical history and problem list. Problem list updated.   Objective  Blood pressure 100/60, weight 197 lb (89.4 kg), last menstrual period 07/09/2019. Pregravid weight 199 lb (90.3 kg) Total Weight Gain -2 lb (-0.907 kg) Urinalysis:      Fetal Status: Fetal Heart Rate (bpm): 135 Fundal Height: 38 cm Movement: Present  Presentation: Vertex  General:  Alert, oriented and cooperative. Patient is in no acute distress.  Skin: Skin is warm and dry. No rash noted.   Cardiovascular: Normal heart rate noted  Respiratory: Normal respiratory effort, no problems with respiration noted  Abdomen: Soft, gravid, appropriate for gestational age. Pain/Pressure: Absent     Pelvic:  Cervical exam deferred        Extremities: Normal range of motion.  Edema: None  ental Status: Normal mood and affect. Normal behavior. Normal judgment and thought content.     Assessment   30 y.o. G2P1001 at [redacted]w[redacted]d by  04/14/2020, by Last Menstrual Period presenting for routine prenatal visit  Plan   pregnancy2  Problems (from 07/09/19 to present)    Problem Noted Resolved   Supervision of other normal pregnancy, antepartum 09/10/2019 by Mirna Mires, CNM No   Overview Addendum 03/21/2020  3:40 PM by Nadara Mustard, MD     Clinic [facility] Prenatal Labs  Dating LMP=10wk [place]. Right intramural fibroid seen Blood type: O/Positive/-- (08/25 1403) O POS  Genetic Screen NIPS:MaternTi 21 was negative XY.  Inheritest negative Antibody:Negative (08/25 1403)neg  Anatomic [place] [facility] nml Rubella: 9.08 (08/25 1403)RI  GTT        Third trimester: 99 RPR: Non Reactive (12/21 1402) nonreactive  Flu vaccine decl HBsAg: Negative (08/25 1403) negative  TDaP vaccine   03/21/20         Rhogam:n/a HIV: Non Reactive (12/21 1402) non reactive  Baby Food Bottle                         GBS: (For PCN allergy, check sensitivities)  Contraception Unsure 02/08/20 [id number] 8/13  Circumcision    Pediatrician    Support Person  FOB Eddie Candle             Previous Version      -Reviewed GBS results, discussed process for treatment in labor  Term labor precuations including but not limited to vaginal bleeding, contractions, leaking of fluid and fetal movement were reviewed in detail with the patient.    Return in about 1 week (around 04/04/2020) for ROB.  [name], CNM, MSN [facility], [facility] 03/28/2020, 4:30 PM

## 2020-04-04 ENCOUNTER — Encounter: Payer: Self-pay | Admitting: Obstetrics and Gynecology

## 2020-04-04 ENCOUNTER — Other Ambulatory Visit: Payer: Self-pay

## 2020-04-04 ENCOUNTER — Ambulatory Visit (INDEPENDENT_AMBULATORY_CARE_PROVIDER_SITE_OTHER): Payer: Medicaid Other | Admitting: Obstetrics and Gynecology

## 2020-04-04 VITALS — BP 120/74 | Ht 62.0 in | Wt 195.0 lb

## 2020-04-04 DIAGNOSIS — Z3480 Encounter for supervision of other normal pregnancy, unspecified trimester: Secondary | ICD-10-CM

## 2020-04-04 DIAGNOSIS — Z3A38 38 weeks gestation of pregnancy: Secondary | ICD-10-CM

## 2020-04-04 LAB — POCT URINALYSIS DIPSTICK OB
Glucose, UA: NEGATIVE
POC,PROTEIN,UA: NEGATIVE

## 2020-04-04 NOTE — Progress Notes (Signed)
    Routine Prenatal Care Visit  Subjective  Kristin Shepard is a 30 y.o. G2P1001 at [redacted]w[redacted]d being seen today for ongoing prenatal care.  She is currently monitored for the following issues for this low-risk pregnancy and has Supervision of other normal pregnancy, antepartum on their problem list.  ----------------------------------------------------------------------------------- Patient reports no complaints.   Contractions: Not present. Vag. Bleeding: None.  Movement: Present. Denies leaking of fluid.  ----------------------------------------------------------------------------------- The following portions of the patient's history were reviewed and updated as appropriate: allergies, current medications, past family history, past medical history, past social history, past surgical history and problem list. Problem list updated.   Objective  Blood pressure 120/74, height 5\' 2"  (1.575 m), weight 195 lb (88.5 kg), last menstrual period 07/09/2019. Pregravid weight 199 lb (90.3 kg) Total Weight Gain -4 lb (-1.814 kg) Urinalysis:      Fetal Status: Fetal Heart Rate (bpm): 120 Fundal Height: 38 cm Movement: Present     General:  Alert, oriented and cooperative. Patient is in no acute distress.  Skin: Skin is warm and dry. No rash noted.   Cardiovascular: Normal heart rate noted  Respiratory: Normal respiratory effort, no problems with respiration noted  Abdomen: Soft, gravid, appropriate for gestational age. Pain/Pressure: Absent     Pelvic:  Cervical exam performed Dilation: 1 Effacement (%): 30 Station: -3  Extremities: Normal range of motion.  Edema: None  Mental Status: Normal mood and affect. Normal behavior. Normal judgment and thought content.     Assessment   30 y.o. G2P1001 at [redacted]w[redacted]d by  04/14/2020, by Last Menstrual Period presenting for routine prenatal visit  Plan   pregnancy2  Problems (from 07/09/19 to present)    Problem Noted Resolved   Supervision of other normal  pregnancy, antepartum 09/10/2019 by 09/12/2019, CNM No   Overview Addendum 03/28/2020  4:30 PM by 05/28/2020, CNM     [facility]inic [facility] Prenatal Labs  Dating LMP=10wk Kristin Shepard. Right intramural fibroid seen Blood type: O/Positive/-- (08/25 1403) O POS  Genetic Screen NIPS:MaternTi 21 was negative XY.  Inheritest negative Antibody:Negative (08/25 1403)neg  Anatomic [birth year]1981 [facility] nml Rubella: 9.08 (08/25 1403)RI  GTT        Third trimester: 99 RPR: Non Reactive (12/21 1402) nonreactive  Flu vaccine decl HBsAg: Negative (08/25 1403) negative  TDaP vaccine   03/21/20         Rhogam:n/a HIV: Non Reactive (12/21 1402) non reactive  Baby Food Bottle                         GBS: (For PCN allergy, check sensitivities)  Contraception Unsure 02/08/20 04/07/20 8/13  Circumcision    Pediatrician    Support Person  FOB 9/13             Previous Version       Gestational age appropriate obstetric precautions including but not limited to vaginal bleeding, contractions, leaking of fluid and fetal movement were reviewed in detail with the patient.    Return in about 1 week (around 04/11/2020) for ROB in person.  04/13/2020 MD [facility], [facility] 04/04/2020, 4:22 PM

## 2020-04-04 NOTE — Addendum Note (Signed)
Addended by: Clement Husbands A on: 04/04/2020 04:33 PM   Modules accepted: Orders

## 2020-04-04 NOTE — Patient Instructions (Signed)
Vaginal Delivery  Vaginal delivery means that you give birth by pushing your baby out of your birth canal (vagina). A team of health care providers will help you before, during, and after vaginal delivery. Birth experiences are unique for every woman and every pregnancy, and birth experiences vary depending on where you choose to give birth. What happens when I arrive at the birth center or hospital? Once you are in labor and have been admitted into the hospital or birth center, your health care provider may:  Review your pregnancy history and any concerns that you have.  Insert an IV into one of your veins. This may be used to give you fluids and medicines.  Check your blood pressure, pulse, temperature, and heart rate (vital signs).  Check whether your bag of water (amniotic sac) has broken (ruptured).  Talk with you about your birth plan and discuss pain control options. Monitoring Your health care provider may monitor your contractions (uterine monitoring) and your baby's heart rate (fetal monitoring). You may need to be monitored:  Often, but not continuously (intermittently).  All the time or for long periods at a time (continuously). Continuous monitoring may be needed if: ? You are taking certain medicines, such as medicine to relieve pain or make your contractions stronger. ? You have pregnancy or labor complications. Monitoring may be done by:  Placing a special stethoscope or a handheld monitoring device on your abdomen to check your baby's heartbeat and to check for contractions.  Placing monitors on your abdomen (external monitors) to record your baby's heartbeat and the frequency and length of contractions.  Placing monitors inside your uterus through your vagina (internal monitors) to record your baby's heartbeat and the frequency, length, and strength of your contractions. Depending on the type of monitor, it may remain in your uterus or on your baby's head until  birth.  Telemetry. This is a type of continuous monitoring that can be done with external or internal monitors. Instead of having to stay in bed, you are able to move around during telemetry. Physical exam Your health care provider may perform frequent physical exams. This may include:  Checking how and where your baby is positioned in your uterus.  Checking your cervix to determine: ? Whether it is thinning out (effacing). ? Whether it is opening up (dilating). What happens during labor and delivery? Normal labor and delivery is divided into the following three stages: Stage 1  This is the longest stage of labor.  This stage can last for hours or days.  Throughout this stage, you will feel contractions. Contractions generally feel mild, infrequent, and irregular at first. They get stronger, more frequent (about every 2-3 minutes), and more regular as you move through this stage.  This stage ends when your cervix is completely dilated to 4 inches (10 cm) and completely effaced. Stage 2  This stage starts once your cervix is completely effaced and dilated and lasts until the delivery of your baby.  This stage may last from 20 minutes to 2 hours.  This is the stage where you will feel an urge to push your baby out of your vagina.  You may feel stretching and burning pain, especially when the widest part of your baby's head passes through the vaginal opening (crowning).  Once your baby is delivered, the umbilical cord will be clamped and cut. This usually occurs after waiting a period of 1-2 minutes after delivery.  Your baby will be placed on your bare chest (skin-to-skin   contact) in an upright position and covered with a warm blanket. Watch your baby for feeding cues, like rooting or sucking, and help the baby to your breast for his or her first feeding. Stage 3  This stage starts immediately after the birth of your baby and ends after you deliver the placenta.  This stage may  take anywhere from 5 to 30 minutes.  After your baby has been delivered, you will feel contractions as your body expels the placenta and your uterus contracts to control bleeding.   What can I expect after labor and delivery?  After labor is over, you and your baby will be monitored closely until you are ready to go home to ensure that you are both healthy. Your health care team will teach you how to care for yourself and your baby.  You and your baby will stay in the same room (rooming in) during your hospital stay. This will encourage early bonding and successful breastfeeding.  You may continue to receive fluids and medicines through an IV.  Your uterus will be checked and massaged regularly (fundal massage).  You will have some soreness and pain in your abdomen, vagina, and the area of skin between your vaginal opening and your anus (perineum).  If an incision was made near your vagina (episiotomy) or if you had some vaginal tearing during delivery, cold compresses may be placed on your episiotomy or your tear. This helps to reduce pain and swelling.  You may be given a squirt bottle to use instead of wiping when you go to the bathroom. To use the squirt bottle, follow these steps: ? Before you urinate, fill the squirt bottle with warm water. Do not use hot water. ? After you urinate, while you are sitting on the toilet, use the squirt bottle to rinse the area around your urethra and vaginal opening. This rinses away any urine and blood. ? Fill the squirt bottle with clean water every time you use the bathroom.  It is normal to have vaginal bleeding after delivery. Wear a sanitary pad for vaginal bleeding and discharge. Summary  Vaginal delivery means that you will give birth by pushing your baby out of your birth canal (vagina).  Your health care provider may monitor your contractions (uterine monitoring) and your baby's heart rate (fetal monitoring).  Your health care provider may  perform a physical exam.  Normal labor and delivery is divided into three stages.  After labor is over, you and your baby will be monitored closely until you are ready to go home. This information is not intended to replace advice given to you by your health care provider. Make sure you discuss any questions you have with your health care provider. Document Revised: 02/18/2017 Document Reviewed: 02/18/2017 Elsevier Patient Education  2021 [employer] Inc[employer] Pain Relief During Labor and Delivery Many things can cause pain during labor and delivery, including:  Pressure due to the baby moving through the pelvis.  Stretching of tissues due to the baby moving through the birth canal.  Muscle tension due to anxiety or nervousness.  The uterus tightening (contracting)and relaxing to help move the baby. How do I get pain relief during labor and delivery? Discuss your pain relief options with your health care provider during your prenatal visits. Explore the options offered by your hospital or birth center. There are many ways to deal with the pain of labor and delivery. You can try relaxation techniques or doing relaxing activities, taking a warm shower or   bath (hydrotherapy), or other methods. There are also many medicines available to help control pain. Relaxation techniques and activities Practice relaxation techniques or do relaxing activities, such as:  Focused breathing.  Meditation.  Visualization.  Aroma therapy.  Listening to your favorite music.  Hypnosis. Hydrotherapy Take a warm shower or bath. This may:  Provide comfort and relaxation.  Lessen your feeling of pain.  Reduce the amount of pain medicine needed.  Shorten the length of labor. Other methods Try doing other things, such as:  Getting a massage or having counterpressure on your back.  Applying warm packs or ice packs.  Changing positions often, moving around, or using a birthing ball. Medicines You may  be given:  Pain medicine through an IV or an injection into a muscle.  Pain medicine inserted into your spinal column.  Injections of sterile water just under the skin on your lower back.  Nitrous oxide inhalation therapy, also called laughing gas.   What kinds of medicine are available for pain relief? There are two kinds of medicines that can be used to relieve pain during labor and delivery:  Analgesics. These medicines decrease pain without causing you to lose feeling or the ability to move your muscles.  Anesthetics. These medicines block feeling in the body and can decrease your ability to move freely. Both kinds of medicine can cause minor side effects, such as nausea, trouble concentrating, and sleepiness. They can also affect the baby's heart rate before birth and his or her breathing after birth. For this reason, health care providers are careful about when and how much medicine is given. Which medicines are used to provide pain relief? Common medicines The most common medicines used to help manage pain during labor and delivery include:  Opioids. Opioids are medicines that decrease how much pain you feel (perception of pain). These medicines can be given through an IV or may be used with anesthetics to block pain.  Epidural analgesia. ? Epidural analgesia is given through a very thin tube that is inserted into the lower back. Medicine is delivered continuously to the area near your spinal column nerves (epidural space). After having this treatment, you may be able to move your legs, but you will not be able to walk. Depending on the amount and type of medicine given, you may lose all feeling in the lower half of your body, or you may have some sensation, including the urge to push. This treatment can be used to give pain relief for a vaginal birth. ? Sometimes, a numbing medicine is injected into the spinal fluid when an epidural catheter is placed. This provides for immediate  relief but only lasts for 1-2 hours. Once it wears off, the epidural will provide pain relief. This is called a combined spinal-epidural (CSE) block.  Intrathecal analgesia (spinal analgesia). Intrathecal analgesia is similar to epidural analgesia, but the medicine is injected into the spinal fluid instead of the epidural space. It is usually only given once. It starts to relieve pain quickly, but the pain relief lasts only 1-2 hours.  Pudendal block. This block is done by injecting numbing medicine through the wall of the vagina and into a nerve in the pelvis. Other medicines Other medicines used to help manage pain during labor and delivery include:  Local anesthetics. These are used to numb a small area of the body. They may be used along with another kind of medicine or used to numb the nerves of the vagina, cervix, and perineum during   perineum during the second stage of labor.  Spinal block (spinal anesthesia). Spinal anesthesia is similar to spinal analgesia, but the medicine that is used contains longer-acting numbing medicines and pain medicines. This type of anesthesia can be used for a cesarean delivery and allows you to stay awake for the birth of your baby.  General anesthetics cause you to lose consciousness so you do not feel pain. They are usually only used for an emergency cesarean delivery. These medicines are given through an IV or a mask or both. These medicines are used as part of a procedure or for an emergency delivery. Summary  Women have many options to help them manage the pain associated with labor and delivery.  You can try doing relaxing activities, taking a warm shower or bath, or other methods.  There are also many medicines available to help control pain during labor and delivery.  Talk with your health care provider about what options are available to you. This information is not intended to replace advice given to you by your health care provider. Make sure you discuss any  questions you have with your health care provider. Document Revised: 12/02/2018 Document Reviewed: 12/02/2018 Elsevier Patient Education  2021 Else[employer] Inc.   Augmentation of Labor Augmentation of labor is when steps are taken to stimulate and strengthen contractions of the uterus during labor. This may be done when contractions have slowed or stopped, and the progress of labor and delivery of the baby is delayed. Before augmentation of labor begins, these factors will be considered:  The mother's medical condition and the baby's condition.  The size and position of the baby.  The size of the birth canal. Tell a health care provider about:  Any allergies you have.  All medicines you are taking, including vitamins, herbs, eye drops, creams, and over-the-counter medicines.  Any problems you or your family members have had with anesthetic medicines.  Any surgeries you have had.  Any blood disorders you have.  Any medical conditions you have. What are the risks? Generally, this is a safe procedure. However, problems may occur, including:  Tearing (rupture) of the uterus.  Breaking off (abruption) of the placenta.  Increased risk of infection for you and your baby.  Increased risk of cesarean, forceps, or vacuum delivery.  Excessive bleeding after delivery (postpartum hemorrhage).  Umbilical cord prolapse. This can cause the umbilical cord to get squeezed during birth.  Too much stimulation of the contractions. This can result in continuous, prolonged, or very strong contractions.  Your baby could fail to get enough blood flow or oxygen. This can be life-threatening (fetal death). What happens during the procedure? Augmentation of labor may include:  Giving you medicine that stimulates contractions (oxytocin). This is given through an IV that is inserted into a vein in your arm.  Breaking the fluid-filled sac that surrounds the fetus (amniotic sac). This procedure is  called artificial rupture of membranes. This procedure may vary among health care providers and hospitals.   Summary  Augmentation of labor is when steps are taken to stimulate and strengthen contractions of the uterus during labor. This may be done when contractions have slowed down or stopped, and the progress of labor and delivery of the baby is delayed.  Labor augmentation may be done using medicine to stimulate contractions (oxytocin). Or it can be done by breaking the fluid-filled sac that surrounds the fetus (amniotic sac).  Generally, this is a safe procedure. However, problems can occur. Talk with your  health care provider about the potential risks and benefits of labor augmentation if this is offered to you. This information is not intended to replace advice given to you by your health care provider. Make sure you discuss any questions you have with your health care provider. Document Revised: 10/28/2019 Document Reviewed: 10/28/2019 Elsevier Patient Education  2021 Elsevier Inc.   

## 2020-04-11 ENCOUNTER — Other Ambulatory Visit: Payer: Self-pay

## 2020-04-11 ENCOUNTER — Ambulatory Visit (INDEPENDENT_AMBULATORY_CARE_PROVIDER_SITE_OTHER): Payer: Medicaid Other | Admitting: Obstetrics

## 2020-04-11 VITALS — BP 100/60 | Wt 193.0 lb

## 2020-04-11 DIAGNOSIS — Z3483 Encounter for supervision of other normal pregnancy, third trimester: Secondary | ICD-10-CM

## 2020-04-11 DIAGNOSIS — Z3A39 39 weeks gestation of pregnancy: Secondary | ICD-10-CM

## 2020-04-11 NOTE — Progress Notes (Signed)
  Routine Prenatal Care Visit  Subjective  Kristin Shepard is a 30 y.o. G2P1001 at [redacted]w[redacted]d being seen today for ongoing prenatal care.  She is currently monitored for the following issues for this high-risk pregnancy and has Supervision of other normal pregnancy, antepartum on their problem list.  ----------------------------------------------------------------------------------- Patient reports no complaints.   Contractions: Not present. Vag. Bleeding: None.  Movement: Present. Leaking Fluid denies.  ----------------------------------------------------------------------------------- The following portions of the patient's history were reviewed and updated as appropriate: allergies, current medications, past family history, past medical history, past social history, past surgical history and problem list. Problem list updated.  Objective  Blood pressure 100/60, weight 193 lb (87.5 kg), last menstrual period 07/09/2019. Pregravid weight 199 lb (90.3 kg) Total Weight Gain -6 lb (-2.722 kg) Urinalysis: Urine Protein    Urine Glucose    Fetal Status:     Movement: Present     General:  Alert, oriented and cooperative. Patient is in no acute distress.  Skin: Skin is warm and dry. No rash noted.   Cardiovascular: Normal heart rate noted  Respiratory: Normal respiratory effort, no problems with respiration noted  Abdomen: Soft, gravid, appropriate for gestational age. Pain/Pressure: Absent     Pelvic:  Cervical exam performed        Extremities: Normal range of motion.     Mental Status: Normal mood and affect. Normal behavior. Normal judgment and thought content.   Assessment   30 y.o. G2P1001 at [redacted]w[redacted]d by  04/14/2020, by Last Menstrual Period presenting for routine prenatal visit  Plan   pregnancy2  Problems (from 07/09/19 to present)    Problem Noted Resolved   Supervision of other normal pregnancy, antepartum 09/10/2019 by Mirna Mires, CNM No   Overview Addendum 03/28/2020  4:30 PM by  [name], CNM     Clinic [facility] Prenatal Labs  Dating LMP=10wk [place]. Right intramural fibroid seen Blood type: O/Positive/-- (08/25 1403) O POS  Genetic Screen NIPS:MaternTi 21 was negative XY.  Inheritest negative Antibody:Negative (08/25 1403)neg  Anatomic [place] [facility] nml Rubella: 9.08 (08/25 1403)RI  GTT        Third trimester: 99 RPR: Non Reactive (12/21 1402) nonreactive  Flu vaccine decl HBsAg: Negative (08/25 1403) negative  TDaP vaccine   03/21/20         Rhogam:n/a HIV: Non Reactive (12/21 1402) non reactive  Baby Food Bottle                         GBS: (For PCN allergy, check sensitivities)  Contraception Unsure 02/08/20 [id number] 8/13  Circumcision    Pediatrician    Support Person  [facility] Eddie Candle             Previous Version       Term labor symptoms and general obstetric precautions including but not limited to vaginal bleeding, contractions, leaking of fluid and fetal movement were reviewed in detail with the patient. Please refer to After Visit Summary for other counseling recommendations.   IOL discussed. She will plan on discussing this formally next week if she has not delivered. Plans epidural.  Return in about 1 week (around 04/18/2020) for return OB, discuss IOL.  Mirna Mires, CNM  04/11/2020 3:29 PM

## 2020-04-17 ENCOUNTER — Encounter: Payer: Self-pay | Admitting: Anesthesiology

## 2020-04-17 ENCOUNTER — Observation Stay
Admission: EM | Admit: 2020-04-17 | Discharge: 2020-04-17 | Disposition: A | Discharge status: Home / Self Care | Payer: Medicaid Other | Source: Home / Self Care | Admitting: Obstetrics and Gynecology

## 2020-04-17 ENCOUNTER — Other Ambulatory Visit: Payer: Self-pay

## 2020-04-17 ENCOUNTER — Encounter: Payer: Self-pay | Admitting: Obstetrics and Gynecology

## 2020-04-17 ENCOUNTER — Encounter: Payer: Self-pay | Admitting: Obstetrics & Gynecology

## 2020-04-17 ENCOUNTER — Inpatient Hospital Stay
Admission: EM | Admit: 2020-04-17 | Discharge: 2020-04-19 | DRG: 806 | Disposition: A | Discharge status: Home / Self Care | Payer: Medicaid Other | Attending: Obstetrics and Gynecology | Admitting: Obstetrics and Gynecology

## 2020-04-17 DIAGNOSIS — O9081 Anemia of the puerperium: Secondary | ICD-10-CM | POA: Diagnosis not present

## 2020-04-17 DIAGNOSIS — Z3A40 40 weeks gestation of pregnancy: Secondary | ICD-10-CM | POA: Insufficient documentation

## 2020-04-17 DIAGNOSIS — O99334 Smoking (tobacco) complicating childbirth: Principal | ICD-10-CM | POA: Diagnosis present

## 2020-04-17 DIAGNOSIS — Z20822 Contact with and (suspected) exposure to covid-19: Secondary | ICD-10-CM | POA: Diagnosis present

## 2020-04-17 DIAGNOSIS — O480 Post-term pregnancy: Secondary | ICD-10-CM

## 2020-04-17 DIAGNOSIS — O26853 Spotting complicating pregnancy, third trimester: Secondary | ICD-10-CM | POA: Insufficient documentation

## 2020-04-17 DIAGNOSIS — F17210 Nicotine dependence, cigarettes, uncomplicated: Secondary | ICD-10-CM | POA: Diagnosis present

## 2020-04-17 DIAGNOSIS — Z3480 Encounter for supervision of other normal pregnancy, unspecified trimester: Secondary | ICD-10-CM

## 2020-04-17 DIAGNOSIS — O26893 Other specified pregnancy related conditions, third trimester: Secondary | ICD-10-CM | POA: Diagnosis present

## 2020-04-17 DIAGNOSIS — Z3490 Encounter for supervision of normal pregnancy, unspecified, unspecified trimester: Secondary | ICD-10-CM

## 2020-04-17 DIAGNOSIS — D62 Acute posthemorrhagic anemia: Secondary | ICD-10-CM | POA: Diagnosis not present

## 2020-04-17 LAB — CBC
HCT: 35.4 % — ABNORMAL LOW (ref 36.0–46.0)
Hemoglobin: 11.6 g/dL — ABNORMAL LOW (ref 12.0–15.0)
MCH: 30.1 pg (ref 26.0–34.0)
MCHC: 32.8 g/dL (ref 30.0–36.0)
MCV: 91.7 fL (ref 80.0–100.0)
Platelets: 236 10*3/uL (ref 150–400)
RBC: 3.86 MIL/uL — ABNORMAL LOW (ref 3.87–5.11)
RDW: 13 % (ref 11.5–15.5)
WBC: 17.2 10*3/uL — ABNORMAL HIGH (ref 4.0–10.5)
nRBC: 0 % (ref 0.0–0.2)

## 2020-04-17 LAB — ABO/RH: ABO/RH(D): O POS

## 2020-04-17 LAB — TYPE AND SCREEN
ABO/RH(D): O POS
Antibody Screen: NEGATIVE

## 2020-04-17 LAB — RESP PANEL BY RT-PCR (FLU A&B, COVID) ARPGX2
Influenza A by PCR: NEGATIVE
Influenza B by PCR: NEGATIVE
SARS Coronavirus 2 by RT PCR: NEGATIVE

## 2020-04-17 MED ORDER — SOD CITRATE-CITRIC ACID 500-334 MG/5ML PO SOLN: 30.0000 mL | ORAL | Status: DC | PRN

## 2020-04-17 MED ORDER — SIMETHICONE 80 MG PO CHEW: 80.0000 mg | CHEWABLE_TABLET | ORAL | Status: DC | PRN

## 2020-04-17 MED ORDER — ONDANSETRON HCL 4 MG PO TABS: 4.0000 mg | ORAL_TABLET | ORAL | Status: DC | PRN

## 2020-04-17 MED ORDER — WITCH HAZEL-GLYCERIN EX PADS: 1.0000 "application " | MEDICATED_PAD | CUTANEOUS | Status: DC | PRN

## 2020-04-17 MED ORDER — OXYTOCIN-SODIUM CHLORIDE 30-0.9 UT/500ML-% IV SOLN
2.5000 [IU]/h | INTRAVENOUS | Status: DC
  Filled 2020-04-17: qty 1000

## 2020-04-17 MED ORDER — ONDANSETRON HCL 4 MG/2ML IJ SOLN: 4.0000 mg | Freq: Four times a day (QID) | INTRAMUSCULAR | Status: DC | PRN

## 2020-04-17 MED ORDER — BENZOCAINE-MENTHOL 20-0.5 % EX AERO
1.0000 "application " | INHALATION_SPRAY | CUTANEOUS | Status: DC | PRN
  Administered 2020-04-17: 1 via TOPICAL
  Filled 2020-04-17: qty 56

## 2020-04-17 MED ORDER — ACETAMINOPHEN 325 MG PO TABS
650.0000 mg | ORAL_TABLET | ORAL | Status: DC | PRN
  Administered 2020-04-17: 650 mg via ORAL
  Filled 2020-04-17: qty 2

## 2020-04-17 MED ORDER — PENICILLIN G POT IN DEXTROSE 60000 UNIT/ML IV SOLN: 3.0000 10*6.[IU] | INTRAVENOUS | Status: DC

## 2020-04-17 MED ORDER — MISOPROSTOL 200 MCG PO TABS
ORAL_TABLET | ORAL | Status: AC
  Filled 2020-04-17: qty 4

## 2020-04-17 MED ORDER — FENTANYL 2.5 MCG/ML W/ROPIVACAINE 0.15% IN NS 100 ML EPIDURAL (ARMC)
EPIDURAL | Status: AC
  Filled 2020-04-17: qty 100

## 2020-04-17 MED ORDER — LIDOCAINE HCL (PF) 1 % IJ SOLN
30.0000 mL | INTRAMUSCULAR | Status: DC | PRN
  Filled 2020-04-17: qty 30

## 2020-04-17 MED ORDER — OXYTOCIN 10 UNIT/ML IJ SOLN
INTRAMUSCULAR | Status: AC
  Filled 2020-04-17: qty 2

## 2020-04-17 MED ORDER — DIBUCAINE (PERIANAL) 1 % EX OINT: 1.0000 "application " | TOPICAL_OINTMENT | CUTANEOUS | Status: DC | PRN

## 2020-04-17 MED ORDER — IBUPROFEN 600 MG PO TABS
600.0000 mg | ORAL_TABLET | Freq: Four times a day (QID) | ORAL | Status: DC
  Administered 2020-04-17 – 2020-04-19 (×7): 600 mg via ORAL
  Filled 2020-04-17 (×9): qty 1

## 2020-04-17 MED ORDER — ACETAMINOPHEN 325 MG PO TABS: 650.0000 mg | ORAL_TABLET | ORAL | Status: DC | PRN

## 2020-04-17 MED ORDER — OXYCODONE HCL 5 MG PO TABS: 5.0000 mg | ORAL_TABLET | ORAL | Status: DC | PRN

## 2020-04-17 MED ORDER — SENNOSIDES-DOCUSATE SODIUM 8.6-50 MG PO TABS
2.0000 | ORAL_TABLET | Freq: Every day | ORAL | Status: DC
  Administered 2020-04-18 – 2020-04-19 (×2): 2 via ORAL
  Filled 2020-04-17 (×2): qty 2

## 2020-04-17 MED ORDER — ONDANSETRON HCL 4 MG/2ML IJ SOLN: 4.0000 mg | INTRAMUSCULAR | Status: DC | PRN

## 2020-04-17 MED ORDER — BUTORPHANOL TARTRATE 1 MG/ML IJ SOLN
1.0000 mg | INTRAMUSCULAR | Status: DC | PRN
  Administered 2020-04-17: 1 mg via INTRAVENOUS
  Filled 2020-04-17: qty 1

## 2020-04-17 MED ORDER — OXYTOCIN BOLUS FROM INFUSION
333.0000 mL | Freq: Once | INTRAVENOUS | Status: AC
  Administered 2020-04-17: 333 mL via INTRAVENOUS

## 2020-04-17 MED ORDER — AMMONIA AROMATIC IN INHA
RESPIRATORY_TRACT | Status: AC
  Filled 2020-04-17: qty 10

## 2020-04-17 MED ORDER — PRENATAL MULTIVITAMIN CH
1.0000 | ORAL_TABLET | Freq: Every day | ORAL | Status: DC
  Administered 2020-04-17 – 2020-04-19 (×3): 1 via ORAL
  Filled 2020-04-17 (×3): qty 1

## 2020-04-17 MED ORDER — ZOLPIDEM TARTRATE 5 MG PO TABS: 5.0000 mg | ORAL_TABLET | Freq: Every evening | ORAL | Status: DC | PRN

## 2020-04-17 MED ORDER — PRENATAL 27-0.8 MG PO TABS
1.0000 | ORAL_TABLET | Freq: Every day | ORAL | Status: DC
  Filled 2020-04-17 (×2): qty 1

## 2020-04-17 MED ORDER — DIPHENHYDRAMINE HCL 25 MG PO CAPS: 25.0000 mg | ORAL_CAPSULE | Freq: Four times a day (QID) | ORAL | Status: DC | PRN

## 2020-04-17 MED ORDER — LACTATED RINGERS IV SOLN: 500.0000 mL | INTRAVENOUS | Status: DC | PRN

## 2020-04-17 MED ORDER — OXYCODONE HCL 5 MG PO TABS: 10.0000 mg | ORAL_TABLET | ORAL | Status: DC | PRN

## 2020-04-17 MED ORDER — SODIUM CHLORIDE 0.9 % IV SOLN
5.0000 10*6.[IU] | Freq: Once | INTRAVENOUS | Status: AC
  Administered 2020-04-17: 5 10*6.[IU] via INTRAVENOUS
  Filled 2020-04-17: qty 5

## 2020-04-17 MED ORDER — COCONUT OIL OIL: 1.0000 "application " | TOPICAL_OIL | Status: DC | PRN

## 2020-04-17 MED ORDER — LACTATED RINGERS IV SOLN: INTRAVENOUS | Status: DC

## 2020-04-17 NOTE — OB Triage Note (Signed)
Pt discharged to home. Pt to be driven by family member who is a bedside. Patient left unit ambulatory with all personal belongings and verbalized understanding of d/c instructions. L&D triage complete, I, RN sign off on care.

## 2020-04-17 NOTE — OB Triage Note (Signed)
Dr. Jerene Pitch notified of patient's arrival and SBAR given including cervical exam 3/50/-3. Patient wishes to schedule induction. Patient placed on pad at arrival and no blood noted on pad. Dr. Jerene Pitch gave orders taht patient be discharged to home with labor precautions and keep all remaining outpatient visits to [facility] where induction can be scheduled (next visit on 04/18/2020). Will notify provider on plan of care.
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
## 2020-04-17 NOTE — OB Triage Note (Signed)
Pat arrived to unit wheeled by ED staff with complaints of pink tinged discharge when wiping. Patient is G2P1 [redacted]w[redacted]d. Patient reports active fetal movement and no symptoms consistent with ROM. Patient placed on EFM and TOCO to non tender area of abdomen. Patient oriented to care environment and triaged to rule out labor. Will notify provider on call of patient's arrival.

## 2020-04-17 NOTE — H&P (Signed)
Obstetric H&P   Chief Complaint: Contractions  Prenatal Care Provider: [facility]  History of Present Illness: 30 y.o. G2P1001 [redacted]w[redacted]d by 04/14/2020, by Last Menstrual Period presenting to L&D with concern for contractions. Patient was seen in [facility] triage overnight for pink discharge and reports mild contractions at that time. Since 0400 patient reports an increase in frequency and strength of contractions. Patient denies VB, decreased FM, or LOF at this time.   Pregravid weight 90.3 kg Total Weight Gain -2.266 kg  pregnancy2  Problems (from 07/09/19 to present)    Problem Noted Resolved   Supervision of other normal pregnancy, antepartum 09/10/2019 by Mirna Mires, CNM No   Overview Addendum 03/28/2020  4:30 PM by [name], CNM     [facility]inic [facility]  Dating LMP=10wk [place]. Right intramural fibroid seen Blood type: O/Positive/-- (08/25 1403) O POS  Genetic Screen NIPS:MaternTi 21 was negative XY.  Inheritest negative Antibody:Negative (08/25 1403)neg  Anatomic [place] [facility] nml Rubella: 9.08 (08/25 1403)RI  GTT        Third trimester: 99 RPR: Non Reactive (12/21 1402) nonreactive  Flu vaccine decl HBsAg: Negative (08/25 1403) negative  TDaP vaccine   03/21/20         Rhogam:n/a HIV: Non Reactive (12/21 1402) non reactive  Baby Food Bottle                         GBS: (For PCN allergy, check sensitivities)  Contraception Unsure 02/08/20 [id number] 8/13  Circumcision    Pediatrician    Support Person  FOB Eddie Candle             Previous Version       Review of Systems: 10 point review of systems negative unless otherwise noted in HPI  Past Medical History: Patient Active Problem List   Diagnosis Date Noted  . Pregnancy 04/17/2020  . Supervision of other normal pregnancy, antepartum 09/10/2019     [facility]inic [facility] Prenatal [facility]  Dating LMP=10wk [place]. Right intramural fibroid seen Blood type: O/Positive/-- (08/25 1403) O POS  Genetic Screen NIPS:MaternTi 21 was  negative XY.  Inheritest negative Antibody:Negative (08/25 1403)neg  Anatomic [place] [facility] nml Rubella: 9.08 (08/25 1403)RI  GTT        Third trimester: 99 RPR: Non Reactive (12/21 1402) nonreactive  Flu vaccine decl HBsAg: Negative (08/25 1403) negative  TDaP vaccine   03/21/20         Rhogam:n/a HIV: Non Reactive (12/21 1402) non reactive  Baby Food Bottle                         GBS: (For PCN allergy, check sensitivities)  Contraception Unsure 02/08/20 GNO:[id number] 8/13  Circumcision    Pediatrician    Support Person  FO[facility] Eddie Candle            Past Surgical History: History reviewed. No pertinent surgical history.  Past Obstetric History: # 1 - Date: 06/17/09, Sex: Female, Weight: 3175 g, GA: [redacted]w[redacted]d, Delivery: Vaginal, Spontaneous, Apgar1: None, Apgar5: None, Living: Living, Birth Comments: None  # 2 - Date: None, Sex: None, Weight: None, GA: None, Delivery: None, Apgar1: None, Apgar5: None, Living: None, Birth Comments: None   Past Gynecologic History:  Family History: Family History  Problem Relation Age of Onset  . Breast cancer Maternal Grandmother        6 y/o    Social History: Social History   Socioeconomic History  .  Marital status: Single    Spouse name: Not on file  . Number of children: Not on file  . Years of education: Not on file  . Highest education level: Not on file  Occupational History  . Not on file  Tobacco Use  . Smoking status: Current Every Day Smoker    Packs/day: 0.10    Types: Cigarettes, Cigars  . Smokeless tobacco: Never Used  . Tobacco comment: smokes 2 cig/day  Vaping Use  . Vaping Use: Never used  Substance and Sexual Activity  . Alcohol use: Not Currently    Comment: last ETOH "months ago" per pt  . Drug use: Not Currently  . Sexual activity: Yes    Partners: Male    Birth control/protection: None  Other Topics Concern  . Not on file  Social History Narrative  . Not on file   Social Determinants of Health   Financial  Resource Strain: Not on file  Food Insecurity: Not on file  Transportation Needs: Not on file  Physical Activity: Not on file  Stress: Not on file  Social Connections: Not on file  Intimate Partner Violence: Not At Risk  . Fear of Current or Ex-Partner: No  . Emotionally Abused: No  . Physically Abused: No  . Sexually Abused: No    Medications: Prior to Admission medications   Medication Sig Start Date End Date Taking? Authorizing Provider  Prenatal Vit-Fe Fumarate-FA (MULTIVITAMIN-PRENATAL) 27-0.8 MG TABS tablet Take 1 tablet by mouth daily at 12 noon.   Yes [provider]    Allergies: No Known Allergies  Physical Exam: Vitals: Blood pressure 103/63, pulse 90, temperature 98.5 F (36.9 C), temperature source Oral, resp. rate 18, height 5\' 2"  (1.575 m), weight 88 kg, last menstrual period 07/09/2019.  FHT: reactive NST on admission - baseline 115 bmp, moderate variability, no accels, no decels Toco: 2-4 min; palpate moderate on exam  General: NAD HEENT: normocephalic, anicteric Pulmonary: No increased work of breathing Cardiovascular: RRR, distal pulses 2+ Abdomen: Gravid, non-tender Leopolds: vtx, EFW: #7 Genitourinary: SVE per nursing 3/80/-2 Extremities: no edema, erythema, or tenderness Neurologic: Grossly intact Psychiatric: mood appropriate, affect full  Labs: Results for orders placed or performed during the hospital encounter of 04/17/20 (from the past 24 hour(s))  CBC     Status: Abnormal   Collection Time: 04/17/20  8:38 AM  Result Value Ref Range   WBC 17.2 (H) 4.0 - 10.5 K/uL   RBC 3.86 (L) 3.87 - 5.11 MIL/uL   Hemoglobin 11.6 (L) 12.0 - 15.0 g/dL   HCT 04/19/20 (L) 16.0 - 73.7 %   MCV 91.7 80.0 - 100.0 fL   MCH 30.1 26.0 - 34.0 pg   MCHC 32.8 30.0 - 36.0 g/dL   RDW 10.6 26.9 - 48.5 %   Platelets 236 150 - 400 K/uL   nRBC 0.0 0.0 - 0.2 %    Assessment: 30 y.o. G2P1001 [redacted]w[redacted]d by 04/14/2020, by Last Menstrual Period in early, active  labor.  Plan: 1) Expectant management of labor  2) Fetus - reactive NST on admission; currently category II, will continue to monitor  3) PNL - Blood type O/Positive/-- (08/25 1403) / Anti-bodyscreen Negative (08/25 1403) / Rubella 9.08 (08/25 1403) / Varicella immune / RPR Non Reactive (12/21 1402) / HBsAg Negative (08/25 1403) / HIV Non Reactive (12/21 1402) / 1-hr OGTT 85 / GBS Positive/-- (02/22 1555) - PCN ordered  4) Immunization History -  Immunization History  Administered Date(s) Administered  .  Tdap 03/21/2020    5) Disposition - Anticipate NSVD  [name], CNM, MSN [facility], [facility] 04/17/2020, 10:01 AM

## 2020-04-17 NOTE — Progress Notes (Signed)
Pt Kristin Shepard 30 y.o. presents to the ED complaining of ctx . Pt is a G2P1001 at [redacted]w[redacted]d . Pt denies signs and symptons consistent with rupture of membranes or active vaginal bleeding. Pt reports ctx that has gotten worse since 0400 this morning when she was discharged from triage.Pt is reporting pain 10/10 and is visibly uncomfortable. Pt had a few episodes of emesis in the room. [place] and TOCO applied to non-tender abdomen and assessing. Initial FHR 125. Vital signs obtained and within normal limits. SVE performed by RN 3/80/-1. Jobie Quaker, CNM notified and admission orders placed.

## 2020-04-18 ENCOUNTER — Encounter: Payer: Medicaid Other | Admitting: Obstetrics & Gynecology

## 2020-04-18 LAB — CBC
HCT: 32.4 % — ABNORMAL LOW (ref 36.0–46.0)
Hemoglobin: 10.5 g/dL — ABNORMAL LOW (ref 12.0–15.0)
MCH: 30 pg (ref 26.0–34.0)
MCHC: 32.4 g/dL (ref 30.0–36.0)
MCV: 92.6 fL (ref 80.0–100.0)
Platelets: 248 10*3/uL (ref 150–400)
RBC: 3.5 MIL/uL — ABNORMAL LOW (ref 3.87–5.11)
RDW: 13.2 % (ref 11.5–15.5)
WBC: 13.9 10*3/uL — ABNORMAL HIGH (ref 4.0–10.5)
nRBC: 0 % (ref 0.0–0.2)

## 2020-04-18 LAB — RPR: RPR Ser Ql: NONREACTIVE

## 2020-04-18 NOTE — Progress Notes (Signed)
Subjective:  Doing well no concerns.  Minimal lochia.  No fevers or chills  Objective:  Vital signs in last 24 hours: Temp:  [98.2 F (36.8 C)-99.1 F (37.3 C)] 99.1 F (37.3 C) (03/21 2329) Pulse Rate:  [66-174] 76 (03/21 2329) Resp:  [18-20] 20 (03/21 2329) BP: (101-127)/(49-87) 101/58 (03/21 2329) SpO2:  [98 %-100 %] 98 % (03/21 2329) Weight:  [88 kg] 88 kg (03/21 0804)    General: NAD Pulmonary: no increased work of breathing Abdomen: non-distended, non-tender, fundus firm at level of umbilicus Extremities: no edema, no erythema, no tenderness  Results for orders placed or performed during the hospital encounter of 04/17/20 (from the past 72 hour(s))  CBC     Status: Abnormal   Collection Time: 04/17/20  8:38 AM  Result Value Ref Range   WBC 17.2 (H) 4.0 - 10.5 K/uL   RBC 3.86 (L) 3.87 - 5.11 MIL/uL   Hemoglobin 11.6 (L) 12.0 - 15.0 g/dL   HCT 26.4 (L) 15.8 - 30.9 %   MCV 91.7 80.0 - 100.0 fL   MCH 30.1 26.0 - 34.0 pg   MCHC 32.8 30.0 - 36.0 g/dL   RDW 40.7 68.0 - 88.1 %   Platelets 236 150 - 400 K/uL   nRBC 0.0 0.0 - 0.2 %    Comment: Performed at [facility], [address], [place], [place] [address]  Resp Panel by RT-PCR (Flu A&B, Covid) Nasopharyngeal Swab     Status: None   Collection Time: 04/17/20  8:38 AM   Specimen: Nasopharyngeal Swab; Nasopharyngeal(NP) swabs in vial transport medium  Result Value Ref Range   SARS Coronavirus 2 by RT PCR NEGATIVE NEGATIVE    Comment: (NOTE) SARS-CoV-2 target nucleic acids are NOT DETECTED.  The SARS-CoV-2 RNA is generally detectable in upper respiratory specimens during the acute phase of infection. The lowest concentration of SARS-CoV-2 viral copies this assay can detect is 138 copies/mL. A negative result does not preclude SARS-Cov-2 infection and should not be used as the sole basis for treatment or other patient management decisions. A negative result may occur with  improper specimen  collection/handling, submission of specimen other than nasopharyngeal swab, presence of viral mutation(s) within the areas targeted by this assay, and inadequate number of viral copies(<138 copies/mL). A negative result must be combined with clinical observations, patient history, and epidemiological information. The expected result is Negative.  Fact Sheet for Patients:  BloggerCourse.com  Fact Sheet for Healthcare Providers:  SeriousBroker.it  This test is no t yet approved or cleared by the [place] FDA and  has been authorized for detection and/or diagnosis of SARS-CoV-2 by FDA under an Emergency Use Authorization (EUA). This EUA will remain  in effect (meaning this test can be used) for the duration of the COVID-19 declaration under Section 564(b)(1) of the Act, 21 U.S.C.section 360bbb-3(b)(1), unless the authorization is terminated  or revoked sooner.       Influenza A by PCR NEGATIVE NEGATIVE   Influenza B by PCR NEGATIVE NEGATIVE    Comment: (NOTE) The Xpert Xpress SARS-CoV-2/FLU/RSV plus assay is intended as an aid in the diagnosis of influenza from Nasopharyngeal swab specimens and should not be used as a sole basis for treatment. Nasal washings and aspirates are unacceptable for Xpert Xpress SARS-CoV-2/FLU/RSV testing.  Fact Sheet for Patients: BloggerCourse.com  Fact Sheet for Healthcare Providers: SeriousBroker.it  This test is not yet approved or cleared by the [place] FDA and has been authorized for detection and/or  diagnosis of SARS-CoV-2 by FDA under an Emergency Use Authorization (EUA). This EUA will remain in effect (meaning this test can be used) for the duration of the COVID-19 declaration under Section 564(b)(1) of the Act, 21 U.S.C. section 360bbb-3(b)(1), unless the authorization is terminated or revoked.  Performed at [facility], [address] [place], [place] [address]   ABO/Rh     Status: None   Collection Time: 04/17/20  8:38 AM  Result Value Ref Range   ABO/RH(D)      O POS Performed at [facility], [address] [place], [place] [address]   Type and screen     Status: None   Collection Time: 04/17/20  9:42 AM  Result Value Ref Range   ABO/RH(D) O POS    Antibody Screen NEG    Sample Expiration      04/20/2020,2359 Performed at [facility], [address] [place], [place] [address]   CBC     Status: Abnormal   Collection Time: 04/18/20  6:37 AM  Result Value Ref Range   WBC 13.9 (H) 4.0 - 10.5 K/uL   RBC 3.50 (L) 3.87 - 5.11 MIL/uL   Hemoglobin 10.5 (L) 12.0 - 15.0 g/dL   HCT 64.6 (L) 80.3 - 21.2 %   MCV 92.6 80.0 - 100.0 fL   MCH 30.0 26.0 - 34.0 pg   MCHC 32.4 30.0 - 36.0 g/dL   RDW 24.8 25.0 - 03.7 %   Platelets 248 150 - 400 K/uL   nRBC 0.0 0.0 - 0.2 %    Comment: Performed at [facility], [address], [place], [place] [address]    Assessment:   30 y.o. G2P2002 postpartum day # 1 TSVD  Plan:    1) Acute blood loss anemia - hemodynamically stable and asymptomatic - po ferrous sulfate  2) Blood Type --/--/O POS (03/21 9169) / Rubella 9.08 (08/25 1403) / Varicella Immune  3) TDAP status up to date  4) Feeding plan bottle  5)  Education given regarding options for contraception, as well as compatibility with breast feeding if applicable.  Patient plans on OCP (estrogen/progesterone) for contraception.  6) Disposition pending delivery  Vena Austria, MD, [facility], [facility] 04/18/2020, 7:40 AM

## 2020-04-19 NOTE — Progress Notes (Signed)
Pt discharged with infant. Discharge instructions, prescriptions, and follow up appointments given to and reviewed with patient. Pt verbalized understanding. To be escorted out by auxillary.  °

## 2020-04-19 NOTE — Discharge Instructions (Signed)
Postpartum Care After Vaginal Delivery The following information offers guidance about how to care for yourself from the time you deliver your baby to 6-12 weeks after delivery (postpartum period). If you have problems or questions, contact your health care provider for more specific instructions. Follow these instructions at home: Vaginal bleeding  It is normal to have vaginal bleeding (lochia) after delivery. Wear a sanitary pad for bleeding and discharge. ? During the first week after delivery, the amount and appearance of lochia is often similar to a menstrual period. ? Over the next few weeks, it will gradually decrease to a dry, yellow-brown discharge. ? For most women, lochia stops completely by 4-6 weeks after delivery, but can vary.  Change your sanitary pads frequently. Watch for any changes in your flow, such as: ? A sudden increase in volume. ? A change in color. ? Large blood clots.  If you pass a blood clot from your vagina, save it and call your health care provider. Do not flush blood clots down the toilet before talking with your health care provider.  Do not use tampons or douches until your health care provider approves.  If you are not breastfeeding, your period should return 6-8 weeks after delivery. If you are feeding your baby breast milk only, your period may not return until you stop breastfeeding. Perineal care  Keep the area between the vagina and the anus (perineum) clean and dry. Use medicated pads and pain-relieving sprays and creams as directed.  If you had a surgical cut in the perineum (episiotomy) or a tear, check the area for signs of infection until you are healed. Check for: ? More redness, swelling, or pain. ? Fluid or blood coming from the cut or tear. ? Warmth. ? Pus or a bad smell.  You may be given a squirt bottle to use instead of wiping to clean the perineum area after you use the bathroom. Pat the area gently to dry it.  To relieve pain  caused by an episiotomy, a tear, or swollen veins in the anus (hemorrhoids), take a warm sitz bath 2-3 times a day. In a sitz bath, the warm water should only come up to your hips and cover your buttocks.   Breast care  In the first few days after delivery, your breasts may feel heavy, full, and uncomfortable (breast engorgement). Milk may also leak from your breasts. Ask your health care provider about ways to help relieve the discomfort.  If you are breastfeeding: ? Wear a bra that supports your breasts and fits well. Use breast pads to absorb milk that leaks. ? Keep your nipples clean and dry. Apply creams and ointments as told. ? You may have uterine contractions every time you breastfeed for up to several weeks after delivery. This helps your uterus return to its normal size. ? If you have any problems with breastfeeding, notify your health care provider or lactation consultant.  If you are not breastfeeding: ? Avoid touching your breasts. Do not squeeze out (express) milk. Doing this can make your breasts produce more milk. ? Wear a good-fitting bra and use cold packs to help with swelling. Intimacy and sexuality  Ask your health care provider when you can engage in sexual activity. This may depend upon: ? Your risk of infection. ? How fast you are healing. ? Your comfort and desire to engage in sexual activity.  You are able to get pregnant after delivery, even if you have not had your period. Talk with   your health care provider about methods of birth control (contraception) or family planning if you desire future pregnancies. Medicines  Take over-the-counter and prescription medicines only as told by your health care provider.  Take an over-the-counter stool softener to help ease bowel movements as told by your health care provider.  If you were prescribed an antibiotic medicine, take it as told by your health care provider. Do not stop taking the antibiotic even if you start to  feel better.  Review all previous and current prescriptions to check for possible transfer into breast milk. Activity  Gradually return to your normal activities as told by your health care provider.  Rest as much as possible. Nap while your baby is sleeping. Eating and drinking  Drink enough fluid to keep your urine pale yellow.  To help prevent or relieve constipation, eat high-fiber foods every day.  Choose healthy eating to support breastfeeding or weight loss goals.  Take your prenatal vitamins until your health care provider tells you to stop.   General tips/recommendations  Do not use any products that contain nicotine or tobacco. These products include cigarettes, chewing tobacco, and vaping devices, such as e-cigarettes. If you need help quitting, ask your health care provider.  Do not drink alcohol, especially if you are breastfeeding.  Do not take medications or drugs that are not prescribed to you, especially if you are breastfeeding.  Visit your health care provider for a postpartum checkup within the first 3-6 weeks after delivery.  Complete a comprehensive postpartum visit no later than 12 weeks after delivery.  Keep all follow-up visits for you and your baby. Contact a health care provider if:  You feel unusually sad or worried.  Your breasts become red, painful, or hard.  You have a fever or other signs of an infection.  You have bleeding that is soaking through one pad an hour or you have blood clots.  You have a severe headache that doesn't go away or you have vision changes.  You have nausea and vomiting and are unable to eat or drink anything for 24 hours. Get help right away if:  You have chest pain or difficulty breathing.  You have sudden, severe leg pain.  You faint or have a seizure.  You have thoughts about hurting yourself or your baby. If you ever feel like you may hurt yourself or others, or have thoughts about taking your own life,  get help right away. Go to your nearest emergency department or:  Call your local emergency services (911 in the [place]  The National Suicide Prevention Lifeline at 1-800-273-8255. This suicide crisis helpline is open 24 hours a day.  Text the Crisis Text Line at 741741 (in the [place] Summary  The period of time after you deliver your newborn up to 6-12 weeks after delivery is called the postpartum period.  Keep all follow-up visits for you and your baby.  Review all previous and current prescriptions to check for possible transfer into breast milk.  Contact a health care provider if you feel unusually sad or worried during the postpartum period. This information is not intended to replace advice given to you by your health care provider. Make sure you discuss any questions you have with your health care provider. Document Revised: 09/30/2019 Document Reviewed: 09/30/2019 Elsevier Patient Education  2021 Elsevier Inc. Postpartum Baby Blues The postpartum period begins right after the birth of a baby. During this time, there is often joy and excitement. It is also a   time of many changes in the life of the parents. A mother may feel happy one minute and sad or stressed the next. These feelings of sadness, called the baby blues, usually happen in the period right after the baby is born and go away within a week or two. What are the causes? The exact cause of this condition is not known. Changes in hormone levels after childbirth are believed to trigger some of the symptoms. Other factors that can play a role in these mood changes include:  Lack of sleep.  Stressful life events, such as financial problems, caring for a loved one, or death of a loved one.  Genetics. What are the signs or symptoms? Symptoms of this condition include:  Changes in mood, such as going from extreme happiness to sadness.  A decrease in concentration.  Difficulty sleeping.  Crying spells and  tearfulness.  Loss of appetite.  Irritability.  Anxiety. If these symptoms last for more than 2 weeks or become more severe, you may have postpartum depression. How is this diagnosed? This condition is diagnosed based on an evaluation of your symptoms. Your health care provider may use a screening tool that includes a list of questions to help identify a person with the baby blues or postpartum depression. How is this treated? The baby blues usually go away on their own in 1-2 weeks. Social support is often what is needed. You will be encouraged to get adequate sleep and rest. Follow these instructions at home: Lifestyle  Get as much rest as you can. Take a nap when the baby sleeps.  Exercise regularly as told by your health care provider. Some women find yoga and walking to be helpful.  Eat a balanced and nourishing diet. This includes plenty of fruits and vegetables, whole grains, and lean proteins.  Do little things that you enjoy. Take a bubble bath, read your favorite magazine, or listen to your favorite music.  Avoid alcohol.  Ask for help with household chores, cooking, grocery shopping, or running errands. Do not try to do everything yourself. Consider hiring a postpartum doula to help. This is a professional who specializes in providing support to new mothers.  Try not to make any major life changes during pregnancy or right after giving birth. This can add stress.      General instructions  Talk to people close to you about how you are feeling. Get support from your partner, family members, friends, or other new moms. You may want to join a support group.  Find ways to manage stress. This may include: ? Writing your thoughts and feelings in a journal. ? Spending time outside. ? Spending time with people who make you laugh.  Try to stay positive in how you think. Think about the things you are grateful for.  Take over-the-counter and prescription medicines only as  told by your health care provider.  Let your health care provider know if you have any concerns.  Keep all postpartum visits. This is important. Contact a health care provider if:  Your baby blues do not go away after 2 weeks. Get help right away if:  You have thoughts of taking your own life (suicidal thoughts), or of harming your baby or someone else.  You see or hear things that are not there (hallucinations). If you ever feel like you may hurt yourself or others, or have thoughts about taking your own life, get help right away. Go to your nearest emergency department or:  Call   Call your local emergency services (911 in the [place]  Call a suicide crisis helpline, such as the National Suicide Prevention Lifeline, at 1-800-273-8255. This is open 24 hours a day in the [place]  Text the Crisis Text Line at 741741 (in the [place] Summary  After giving birth, you may feel happy one minute and sad or stressed the next. Feelings of sadness that happen right after the baby is born and go away after a week or two are called the baby blues.  You can manage the baby blues by getting enough rest, eating a healthy diet, exercising, spending time with supportive people, and finding ways to manage stress.  If feelings of sadness and stress last longer than 2 weeks or get in the way of caring for your baby, talk with your health care provider. This may mean you have postpartum depression. This information is not intended to replace advice given to you by your health care provider. Make sure you discuss any questions you have with your health care provider. Document Revised: 07/09/2019 Document Reviewed: 07/09/2019 Elsevier Patient Education  2021 Elsevier Inc.     

## 2020-04-19 NOTE — Discharge Summary (Signed)
Postpartum Discharge Summary  Date of Service updated03/23/2022     Patient Name: Kristin Shepard DOB: [birth year] MRN: [id number]  Date of admission: 04/17/2020 Delivery date:04/17/2020  Delivering provider: Orlie Pollen  Date of discharge: 04/19/2020  Admitting diagnosis: Pregnancy [Z34.90] Intrauterine pregnancy: [redacted]w[redacted]d    Secondary diagnosis:  Active Problems:   Pregnancy   Vaginal delivery   Postpartum care following vaginal delivery  Additional problems: none    Discharge diagnosis: Term Pregnancy Delivered                                              Post partum procedures:none Augmentation: N/A Complications: None  Hospital course: Onset of Labor With Vaginal Delivery      30y.o. yo [id number] admitted in Active Labor on 04/17/2020. Patient had an uncomplicated labor course as follows:  Membrane Rupture Time/Date: 9:49 AM ,04/17/2020   Delivery Method:Vaginal, Spontaneous  Episiotomy: None  Lacerations:  None  Patient had an uncomplicated postpartum course.  She is ambulating, tolerating a regular diet, passing flatus, and urinating well. Patient is discharged home in stable condition on 04/19/20.  Newborn Data: Birth date:04/17/2020  Birth time:10:13 AM  Gender:Female  Living status:Living  Apgars:8 ,9  Weight:2950 g   Magnesium Sulfate received: No BMZ received: No Rhophylac:N/A MMR:No T-DaP:Given prenatally Flu: No Transfusion:No  Physical exam  Vitals:   04/18/20 0813 04/18/20 1522 04/18/20 2305 04/19/20 0811  BP: (!) 101/52 102/71 (!) 103/52 102/72  Pulse: 80 73 80 61  Resp: _0 Temp: 98.6 F (37 C) 98.5 F (36.9 C) 98.3 F (36.8 C) 98.4 F (36.9 C)  TempSrc: Oral  Oral Oral  SpO2: 99% 98% 98% 97%  Weight:      Height:       General: alert, cooperative and no distress Lochia: appropriate Uterine Fundus: firm Incision: N/A DVT Evaluation: No evidence of DVT seen on physical exam. Negative Homan's sign. Labs: Lab  Results  Component Value Date   WBC 13.9 (H) 04/18/2020   HGB 10.5 (L) 04/18/2020   HCT 32.4 (L) 04/18/2020   MCV 92.6 04/18/2020   PLT 248 04/18/2020   CMP Latest Ref Rng & Units 04/27/2011  Glucose 65 - 99 mg/dL 121(H)  BUN 7 - 18 mg/dL 6(L)  Creatinine 0.60 - 1.30 mg/dL 0.79  Sodium 136 - 145 mmol/L 144  Potassium 3.5 - 5.1 mmol/L 2.9(L)  Chloride 98 - 107 mmol/L 106  CO2 21 - 32 mmol/L 24  Calcium 8.5 - 10.1 mg/dL 8.5  Total Protein 6.4 - 8.2 g/dL 7.7  Total Bilirubin 0.2 - 1.0 mg/dL 0.7  Alkaline Phos 50 - 136 Unit/L 80  AST 15 - 37 Unit/L 25  ALT U/L 16   Edinburgh Score: Edinburgh Postnatal Depression Scale Screening Tool 04/17/2020  I have been able to laugh and see the funny side of things. 0  I have looked forward with enjoyment to things. 0  I have blamed myself unnecessarily when things went wrong. 1  I have been anxious or worried for no good reason. 1  I have felt scared or panicky for no good reason. 0  Things have been getting on top of me. 0  I have been so unhappy that I have had difficulty sleeping. 1  I have felt sad or miserable. 1  I have  been so unhappy that I have been crying. 0  The thought of harming myself has occurred to me. 0  Edinburgh Postnatal Depression Scale Total 4      After visit meds:  Allergies as of 04/19/2020   No Known Allergies     Medication List    TAKE these medications   multivitamin-prenatal 27-0.8 MG Tabs tablet Take 1 tablet by mouth daily at 12 noon.        Discharge home in stable condition Infant Feeding: Bottle Infant Disposition:home with mother Discharge instruction: per After Visit Summary and Postpartum booklet. Activity: Advance as tolerated. Pelvic rest for 6 weeks.  Diet: routine diet Anticipated Birth Control: Unsure  Discussed IUD, the Ring, OCPs today Postpartum Appointment:6 weeks Additional Postpartum F/U: no Future Appointments:No future appointments. Follow up Visit:  Follow-up  Information    Orlie Pollen, CNM. Schedule an appointment as soon as possible for a visit in 6 week(s).   Specialty: Obstetrics and Gynecology Contact information: [place] [address] [phone]              Additional time spent addressing options for contraception, including LARCs, OCPs, Nuva Ring. She is advised to make her 6 wk PP appt with ample time for IUD or Nexplanon placement should she decide on either of those methods.     04/19/2020 Kristin Shepard, CNM

## 2020-05-30 ENCOUNTER — Other Ambulatory Visit: Payer: Self-pay

## 2020-05-30 ENCOUNTER — Ambulatory Visit (INDEPENDENT_AMBULATORY_CARE_PROVIDER_SITE_OTHER): Payer: Medicaid Other | Admitting: Obstetrics and Gynecology

## 2020-05-30 ENCOUNTER — Encounter: Payer: Self-pay | Admitting: Obstetrics and Gynecology

## 2020-05-30 VITALS — BP 120/72 | HR 79 | Ht 62.0 in | Wt 181.0 lb

## 2020-05-30 DIAGNOSIS — B9689 Other specified bacterial agents as the cause of diseases classified elsewhere: Secondary | ICD-10-CM

## 2020-05-30 DIAGNOSIS — Z1332 Encounter for screening for maternal depression: Secondary | ICD-10-CM

## 2020-05-30 DIAGNOSIS — Z30013 Encounter for initial prescription of injectable contraceptive: Secondary | ICD-10-CM

## 2020-05-30 DIAGNOSIS — N760 Acute vaginitis: Secondary | ICD-10-CM | POA: Diagnosis not present

## 2020-05-30 MED ORDER — MEDROXYPROGESTERONE ACETATE 150 MG/ML IM SUSP: 150.0000 mg | INTRAMUSCULAR | 3 refills | Status: AC

## 2020-05-30 MED ORDER — METRONIDAZOLE 500 MG PO TABS: 500.0000 mg | ORAL_TABLET | Freq: Two times a day (BID) | ORAL | 0 refills | Status: AC

## 2020-05-30 NOTE — Progress Notes (Addendum)
Postpartum Visit  Chief Complaint:  Chief Complaint  Patient presents with  . Postpartum Care  . Contraception    Desires depo    History of Present Illness: Patient is a 30 y.o. [id number] presents for postpartum visit.  Date of delivery: 04/17/20 Type of delivery: Vaginal delivery - Vacuum or forceps assisted  no Episiotomy No.  Laceration: no  Pregnancy or labor problems:  no Any problems since the delivery:  Yes - patient reports a change in vaginal discharge associated with an odor today, denies any symptoms of vaginal irritation  Newborn Details:  SINGLETON :  1. BabyGender female. Birth weight:   2950g Maternal Details:  Breast or formula feeding: formula feeding Intercourse: No  Contraception after delivery: Desires depo - rx'd today Any bowel or bladder issues: No  Post partum depression/anxiety noted:  no Edinburgh Post-Partum Depression Score: 3 Date of last PAP: 08/2019  NILM   Review of Systems: Review of Systems  All other systems reviewed and are negative.  The following portions of the patient's history were reviewed and updated as appropriate: allergies, current medications, past family history, past medical history, past social history, past surgical history and problem list.  Past Medical History:  No past medical history on file.  Past Surgical History:  No past surgical history on file.  Family History:  Family History  Problem Relation Age of Onset  . Breast cancer Maternal Grandmother        21 y/o    Social History:  Social History   Socioeconomic History  . Marital status: Single    Spouse name: Not on file  . Number of children: Not on file  . Years of education: Not on file  . Highest education level: Not on file  Occupational History  . Not on file  Tobacco Use  . Smoking status: Current Every Day Smoker    Packs/day: 0.10    Types: Cigarettes, Cigars  . Smokeless tobacco: Never Used  . Tobacco comment: smokes 2 cig/day   Vaping Use  . Vaping Use: Never used  Substance and Sexual Activity  . Alcohol use: Not Currently    Comment: last ETOH "months ago" per pt  . Drug use: Not Currently  . Sexual activity: Yes    Partners: Male    Birth control/protection: None  Other Topics Concern  . Not on file  Social History Narrative  . Not on file   Social Determinants of Health   Financial Resource Strain: Not on file  Food Insecurity: Not on file  Transportation Needs: Not on file  Physical Activity: Not on file  Stress: Not on file  Social Connections: Not on file  Intimate Partner Violence: Not At Risk  . Fear of Current or Ex-Partner: No  . Emotionally Abused: No  . Physically Abused: No  . Sexually Abused: No    Allergies:  No Known Allergies  Medications: Prior to Admission medications   Medication Sig Start Date End Date Taking? Authorizing Provider  medroxyPROGESTERone (DEPO-PROVERA) 150 MG/ML injection Inject 1 mL (150 mg total) into the muscle every 3 (three) months. 05/30/20  Yes Marvens Hollars, CNM  metroNIDAZOLE (FLAGYL) 500 MG tablet Take 1 tablet (500 mg total) by mouth 2 (two) times daily for 7 days. 05/30/20 06/06/20 Yes Juanisha Bautch, CNM  Prenatal Vit-Fe Fumarate-FA (MULTIVITAMIN-PRENATAL) 27-0.8 MG TABS tablet Take 1 tablet by mouth daily at 12 noon. Patient not taking: Reported on 05/30/2020    [provider]  Physical Exam Blood pressure 120/72, pulse 79, height 5\' 2"  (1.575 m), weight 181 lb (82.1 kg), last menstrual period 05/23/2020, not currently breastfeeding.   General: NAD HEENT: normocephalic, anicteric Pulmonary: No increased work of breathing Abdomen: NABS, soft, non-tender, non-distended.  Umbilicus without lesions.  No hepatomegaly, splenomegaly or masses palpable. No evidence of hernia. Genitourinary:  External: Normal external female genitalia.  Normal urethral meatus, normal  Bartholin's and Skene's glands.    Vagina: Normal vaginal mucosa, no evidence  of prolapse.  Small amount of adherent yellow-gray discharge noted.  Cervix: Grossly normal in appearance, no bleeding  Uterus: Non-enlarged, mobile, normal contour.  No CMT  Adnexa: ovaries non-enlarged, no adnexal masses  Rectal: deferred Extremities: no edema, erythema, or tenderness Neurologic: Grossly intact Psychiatric: mood appropriate, affect full   Edinburgh Postnatal Depression Scale - 05/30/20 1459      Edinburgh Postnatal Depression Scale:  In the Past 7 Days   I have been able to laugh and see the funny side of things. 0    I have looked forward with enjoyment to things. 1    I have blamed myself unnecessarily when things went wrong. 1    I have been anxious or worried for no good reason. 1    I have felt scared or panicky for no good reason. 0    Things have been getting on top of me. 0    I have been so unhappy that I have had difficulty sleeping. 0    I have felt sad or miserable. 0    I have been so unhappy that I have been crying. 0    The thought of harming myself has occurred to me. 0    Edinburgh Postnatal Depression Scale Total 3           Wet Prep: PH: 4.5 Clue Cells: Positive Fungal elements: Negative Trichomonas: N/A  Assessment: 30 y.o. 37 presenting for 6 week postpartum visit with vaginal discharge  Plan: Problem List Items Addressed This Visit      Other   Postpartum care following vaginal delivery    Other Visit Diagnoses    Bacterial vaginosis    -  Primary   Relevant Medications   metroNIDAZOLE (FLAGYL) 500 MG tablet   Encounter for initial prescription of injectable contraceptive       Relevant Medications   medroxyPROGESTERone (DEPO-PROVERA) 150 MG/ML injection   Encounter for screening for maternal depression           1) Contraception - Education given regarding options for contraception, as well as compatibility with breast feeding if applicable.  Patient plans on Depo-Provera injections for contraception.  2)  Pap -  ASCCP guidelines and rational discussed.  ASCCP guidelines and rational discussed.  Patient opts for every 3 years screening interval  3) Patient underwent screening for postpartum depression with no signs of depression   4)  Vaginal discharge - wet mount + clue cells. Metronidazole rx'd.  4) Return for Nurse visit for depo.   [id number], CNM, MSN [facility], [facility] 05/30/2020, 3:22 PM

## 2020-06-01 ENCOUNTER — Other Ambulatory Visit: Payer: Self-pay

## 2020-06-01 ENCOUNTER — Ambulatory Visit (INDEPENDENT_AMBULATORY_CARE_PROVIDER_SITE_OTHER): Payer: Medicaid Other

## 2020-06-01 DIAGNOSIS — Z3042 Encounter for surveillance of injectable contraceptive: Secondary | ICD-10-CM

## 2020-06-01 LAB — POCT URINE PREGNANCY: Preg Test, Ur: NEGATIVE

## 2020-06-01 MED ORDER — MEDROXYPROGESTERONE ACETATE 150 MG/ML IM SUSP
150.0000 mg | Freq: Once | INTRAMUSCULAR | Status: AC
  Administered 2020-06-01: 150 mg via INTRAMUSCULAR

## 2020-06-01 NOTE — Progress Notes (Signed)
Pt here for depo which was given IM right glut after negative urine preg.  NDC# 1031-2811-88

## 2020-08-24 ENCOUNTER — Other Ambulatory Visit: Payer: Self-pay

## 2020-08-24 ENCOUNTER — Ambulatory Visit (INDEPENDENT_AMBULATORY_CARE_PROVIDER_SITE_OTHER): Payer: Medicaid Other

## 2020-08-24 DIAGNOSIS — Z3042 Encounter for surveillance of injectable contraceptive: Secondary | ICD-10-CM

## 2020-08-24 MED ORDER — MEDROXYPROGESTERONE ACETATE 150 MG/ML IM SUSP
150.0000 mg | Freq: Once | INTRAMUSCULAR | Status: AC
  Administered 2020-08-24: 150 mg via INTRAMUSCULAR

## 2020-08-24 NOTE — Progress Notes (Signed)
Pt here for depo which was given IM right deltoid.  NDC# 3545-6256-38

## 2020-11-16 ENCOUNTER — Ambulatory Visit (INDEPENDENT_AMBULATORY_CARE_PROVIDER_SITE_OTHER): Payer: Medicaid Other

## 2020-11-16 ENCOUNTER — Other Ambulatory Visit: Payer: Self-pay

## 2020-11-16 DIAGNOSIS — Z3042 Encounter for surveillance of injectable contraceptive: Secondary | ICD-10-CM | POA: Diagnosis not present

## 2020-11-16 MED ORDER — MEDROXYPROGESTERONE ACETATE 150 MG/ML IM SUSP
150.0000 mg | Freq: Once | INTRAMUSCULAR | Status: AC
  Administered 2020-11-16: 150 mg via INTRAMUSCULAR

## 2020-11-16 NOTE — Progress Notes (Signed)
Pt here for depo which was given IM right deltoid.  Pt tolerated well.  NDC# 0548-5701-00 °

## 2021-02-08 ENCOUNTER — Ambulatory Visit: Payer: Medicaid Other

## 2021-06-20 ENCOUNTER — Ambulatory Visit: Payer: Medicaid Other | Admitting: Advanced Practice Midwife

## 2021-06-20 ENCOUNTER — Encounter: Payer: Self-pay | Admitting: Advanced Practice Midwife

## 2021-06-20 DIAGNOSIS — B379 Candidiasis, unspecified: Secondary | ICD-10-CM

## 2021-06-20 DIAGNOSIS — Z113 Encounter for screening for infections with a predominantly sexual mode of transmission: Secondary | ICD-10-CM

## 2021-06-20 DIAGNOSIS — T7411XS Adult physical abuse, confirmed, sequela: Secondary | ICD-10-CM

## 2021-06-20 DIAGNOSIS — T7411XA Adult physical abuse, confirmed, initial encounter: Secondary | ICD-10-CM | POA: Insufficient documentation

## 2021-06-20 LAB — WET PREP FOR TRICH, YEAST, CLUE: Trichomonas Exam: NEGATIVE

## 2021-06-20 MED ORDER — CLOTRIMAZOLE 1 % VA CREA: 1.0000 | TOPICAL_CREAM | Freq: Every day | VAGINAL | 0 refills | Status: AC

## 2021-06-20 NOTE — Addendum Note (Signed)
Addended by: Windle Guard on: 06/20/2021 10:16 AM   Modules accepted: Orders

## 2021-06-20 NOTE — Progress Notes (Signed)
[facility] Department  STI clinic/screening visit [address] [place] [address] [phone]  Subjective:  Kristin Shepard is a 31 y.o. SBF G2P2 smoker female being seen today for an STI screening visit. The patient reports they do have symptoms.  Patient reports that they do not desire a pregnancy in the next year.   They reported they are not interested in discussing contraception today.    No LMP recorded.   Patient has the following medical conditions:   Patient Active Problem List   Diagnosis Date Noted   Morbid obesity (HCC)  181 lbs 06/20/2021   Postpartum care following vaginal delivery 04/19/2020   Vaginal delivery 04/17/2020    Chief Complaint  Patient presents with   SEXUALLY TRANSMITTED DISEASE    Screening    HPI  Patient reports grey-white malodorous d/c since 01/2021. Last sex 07/2020 without condom; no longer with that partner. LMP  maybe mid April?. Last cigar today. Last MJ 2021. Last DMPA 10/2020. Last ETOH 09/2020 (2 glasses wine).   Last HIV test per patient/review of record was 09/10/19 Patient reports last pap was 09/10/2019 neg  Screening for MPX risk: Does the patient have an unexplained rash? No Is the patient MSM? No Does the patient endorse multiple sex partners or anonymous sex partners? No Did the patient have close or sexual contact with a person diagnosed with MPX? No Has the patient traveled outside the [place] where MPX is endemic? No Is there a high clinical suspicion for MPX-- evidenced by one of the following No  -Unlikely to be chickenpox  -Lymphadenopathy  -Rash that present in same phase of evolution on any given body part See flowsheet for further details and programmatic requirements.   Immunization history:  Immunization History  Administered Date(s) Administered   Hpv-Unspecified 06/03/2006   Tdap 03/21/2020     The following portions of the patient's history were reviewed and updated as appropriate:  allergies, current medications, past medical history, past social history, past surgical history and problem list.  Objective:  There were no vitals filed for this visit.  Physical Exam Vitals and nursing note reviewed.  Constitutional:      Appearance: Normal appearance. She is obese.  HENT:     Head: Normocephalic and atraumatic.     Mouth/Throat:     Mouth: Mucous membranes are moist.     Pharynx: Oropharynx is clear. No oropharyngeal exudate or posterior oropharyngeal erythema.  Pulmonary:     Effort: Pulmonary effort is normal.  Abdominal:     General: Abdomen is flat.     Palpations: There is no mass.     Tenderness: There is no abdominal tenderness. There is no rebound.     Comments: Soft without masses or tenderness, poor tone  Genitourinary:    General: Normal vulva.     Exam position: Lithotomy position.     Pubic Area: No rash or pubic lice.      Labia:        Right: No rash or lesion.        Left: No rash or lesion.      Vagina: Vaginal discharge (thick curdy yellow leukorrhea, ph<4.5) present. No erythema, bleeding or lesions.     Cervix: Normal.     Uterus: Normal.      Adnexa: Right adnexa normal and left adnexa normal.     Rectum: Normal.  Lymphadenopathy:     Head:     Right side of head: No preauricular  or posterior auricular adenopathy.     Left side of head: No preauricular or posterior auricular adenopathy.     Cervical: No cervical adenopathy.     Right cervical: No superficial, deep or posterior cervical adenopathy.    Left cervical: No superficial, deep or posterior cervical adenopathy.     Upper Body:     Right upper body: No supraclavicular or axillary adenopathy.     Left upper body: No supraclavicular or axillary adenopathy.     Lower Body: No right inguinal adenopathy. No left inguinal adenopathy.  Skin:    General: Skin is warm and dry.     Findings: No rash.  Neurological:     Mental Status: She is alert and oriented to person, place, and  time.     Assessment and Plan:  Kristin Shepard is a 31 y.o. female presenting to the [facility] Department for STI screening  1. Screening examination for venereal disease Treat wet mount per standing orders Immunization nurse consult - WET PREP FOR [place], YEAST, CLUE - Chlamydia/Gonorrhea [facility]  2. Morbid obesity (HCC)      No follow-ups on file.  No future appointments.  Alberteen Spindle, CNM

## 2021-06-20 NOTE — Progress Notes (Signed)
Pt here for STD screening.  Wet mount results reviewed. The patient was dispensed Clotrimazole Vaginal Cream today. I provided counseling today regarding the medication. We discussed the medication, the side effects and when to call clinic. Patient given the opportunity to ask questions. Questions answered.  Berdie Ogren, RN

## 2021-11-02 ENCOUNTER — Ambulatory Visit: Payer: Medicaid Other | Admitting: Nurse Practitioner

## 2021-11-02 ENCOUNTER — Encounter: Payer: Self-pay | Admitting: Nurse Practitioner

## 2021-11-02 DIAGNOSIS — B379 Candidiasis, unspecified: Secondary | ICD-10-CM

## 2021-11-02 DIAGNOSIS — Z113 Encounter for screening for infections with a predominantly sexual mode of transmission: Secondary | ICD-10-CM

## 2021-11-02 LAB — WET PREP FOR TRICH, YEAST, CLUE: Trichomonas Exam: NEGATIVE

## 2021-11-02 MED ORDER — CLOTRIMAZOLE 3 2 % VA CREA: 1.0000 | TOPICAL_CREAM | Freq: Every day | VAGINAL | 0 refills | Status: AC
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
## 2021-11-04 NOTE — Progress Notes (Signed)
Chart reviewed by Pharmacist  Suzanne Walker PharmD, Contract Pharmacist at [facility]  

## 2022-01-17 ENCOUNTER — Encounter: Payer: Self-pay | Admitting: Family

## 2022-01-17 ENCOUNTER — Ambulatory Visit: Payer: Medicaid Other | Admitting: Family Medicine

## 2022-01-17 DIAGNOSIS — Z113 Encounter for screening for infections with a predominantly sexual mode of transmission: Secondary | ICD-10-CM | POA: Diagnosis not present

## 2022-01-17 LAB — WET PREP FOR TRICH, YEAST, CLUE
Trichomonas Exam: NEGATIVE
Yeast Exam: NEGATIVE

## 2022-01-17 NOTE — Progress Notes (Signed)
Wet prep reviewed by Aliene Altes FNP. Jossie Ng, RN
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
## 2022-09-02 ENCOUNTER — Ambulatory Visit
Admission: EM | Admit: 2022-09-02 | Discharge: 2022-09-02 | Disposition: A | Discharge status: Home / Self Care | Payer: Medicaid Other | Attending: Physician Assistant | Admitting: Physician Assistant

## 2022-09-02 ENCOUNTER — Encounter: Payer: Self-pay | Admitting: Emergency Medicine

## 2022-09-02 ENCOUNTER — Other Ambulatory Visit: Payer: Self-pay

## 2022-09-02 DIAGNOSIS — N898 Other specified noninflammatory disorders of vagina: Secondary | ICD-10-CM | POA: Insufficient documentation

## 2022-09-02 LAB — POCT URINE PREGNANCY: Preg Test, Ur: NEGATIVE

## 2022-09-02 NOTE — ED Triage Notes (Signed)
Patient reports vaginal discharge for 1-2 weeks.  Patient has noticed odor.  Reports July period consisted of a lot of clots.  The period prior to July was light in flow.  Patient has not been on any birth control
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
